# Patient Record
Sex: Male | Born: 1985 | Race: White | Hispanic: No | Marital: Single | Smoking: Never smoker
Health system: Southern US, Community
[De-identification: ages and names within clinical notes are randomized; demographics above are authoritative.]

## PROBLEM LIST (undated history)

## (undated) DIAGNOSIS — N200 Calculus of kidney: Secondary | ICD-10-CM

## (undated) DIAGNOSIS — F419 Anxiety disorder, unspecified: Secondary | ICD-10-CM

## (undated) DIAGNOSIS — Z87440 Personal history of urinary (tract) infections: Secondary | ICD-10-CM

## (undated) HISTORY — PX: LITHOTRIPSY: SUR834

---
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
## 2006-02-09 ENCOUNTER — Emergency Department: Payer: Self-pay | Admitting: Emergency Medicine

## 2006-06-15 ENCOUNTER — Emergency Department: Payer: Self-pay | Admitting: General Practice

## 2006-06-16 ENCOUNTER — Emergency Department: Payer: Self-pay | Admitting: Emergency Medicine

## 2008-12-23 ENCOUNTER — Emergency Department: Payer: Self-pay | Admitting: Emergency Medicine

## 2009-03-03 ENCOUNTER — Emergency Department (HOSPITAL_COMMUNITY): Admission: EM | Admit: 2009-03-03 | Discharge: 2009-03-04 | Payer: Self-pay | Admitting: Emergency Medicine

## 2013-03-01 ENCOUNTER — Emergency Department (HOSPITAL_COMMUNITY): Payer: BC Managed Care – PPO

## 2013-03-01 ENCOUNTER — Emergency Department (HOSPITAL_COMMUNITY)
Admission: EM | Admit: 2013-03-01 | Discharge: 2013-03-01 | Disposition: A | Discharge status: Home / Self Care | Payer: BC Managed Care – PPO | Attending: Emergency Medicine | Admitting: Emergency Medicine

## 2013-03-01 ENCOUNTER — Encounter (HOSPITAL_COMMUNITY): Payer: Self-pay | Admitting: *Deleted

## 2013-03-01 DIAGNOSIS — Z79899 Other long term (current) drug therapy: Secondary | ICD-10-CM | POA: Insufficient documentation

## 2013-03-01 DIAGNOSIS — R112 Nausea with vomiting, unspecified: Secondary | ICD-10-CM | POA: Insufficient documentation

## 2013-03-01 DIAGNOSIS — Z87442 Personal history of urinary calculi: Secondary | ICD-10-CM | POA: Insufficient documentation

## 2013-03-01 DIAGNOSIS — N23 Unspecified renal colic: Secondary | ICD-10-CM

## 2013-03-01 HISTORY — DX: Calculus of kidney: N20.0

## 2013-03-01 LAB — CBC WITH DIFFERENTIAL/PLATELET
Basophils Absolute: 0 10*3/uL (ref 0.0–0.1)
Basophils Relative: 0 % (ref 0–1)
Eosinophils Absolute: 0.1 10*3/uL (ref 0.0–0.7)
Eosinophils Relative: 1 % (ref 0–5)
HCT: 49.9 % (ref 39.0–52.0)
Hemoglobin: 18.3 g/dL — ABNORMAL HIGH (ref 13.0–17.0)
Lymphocytes Relative: 15 % (ref 12–46)
Lymphs Abs: 2.1 10*3/uL (ref 0.7–4.0)
MCH: 31 pg (ref 26.0–34.0)
MCHC: 36.7 g/dL — ABNORMAL HIGH (ref 30.0–36.0)
MCV: 84.4 fL (ref 78.0–100.0)
Monocytes Absolute: 1.1 10*3/uL — ABNORMAL HIGH (ref 0.1–1.0)
Monocytes Relative: 8 % (ref 3–12)
Neutro Abs: 10.4 10*3/uL — ABNORMAL HIGH (ref 1.7–7.7)
Neutrophils Relative %: 76 % (ref 43–77)
Platelets: 214 10*3/uL (ref 150–400)
RBC: 5.91 MIL/uL — ABNORMAL HIGH (ref 4.22–5.81)
RDW: 12.7 % (ref 11.5–15.5)
WBC: 13.7 10*3/uL — ABNORMAL HIGH (ref 4.0–10.5)

## 2013-03-01 LAB — COMPREHENSIVE METABOLIC PANEL
ALT: 140 U/L — ABNORMAL HIGH (ref 0–53)
AST: 71 U/L — ABNORMAL HIGH (ref 0–37)
Albumin: 4.8 g/dL (ref 3.5–5.2)
Alkaline Phosphatase: 52 U/L (ref 39–117)
BUN: 19 mg/dL (ref 6–23)
CO2: 29 mEq/L (ref 19–32)
Calcium: 10.1 mg/dL (ref 8.4–10.5)
Chloride: 104 mEq/L (ref 96–112)
Creatinine, Ser: 1.46 mg/dL — ABNORMAL HIGH (ref 0.50–1.35)
GFR calc Af Amer: 75 mL/min — ABNORMAL LOW (ref >90)
GFR calc non Af Amer: 65 mL/min — ABNORMAL LOW (ref >90)
Glucose, Bld: 104 mg/dL — ABNORMAL HIGH (ref 70–99)
Potassium: 4.2 mEq/L (ref 3.5–5.1)
Sodium: 143 mEq/L (ref 135–145)
Total Bilirubin: 0.5 mg/dL (ref 0.3–1.2)
Total Protein: 8.1 g/dL (ref 6.0–8.3)

## 2013-03-01 LAB — URINALYSIS, ROUTINE W REFLEX MICROSCOPIC
Bilirubin Urine: NEGATIVE
Glucose, UA: NEGATIVE mg/dL
Hgb urine dipstick: NEGATIVE
Ketones, ur: 15 mg/dL — AB
Nitrite: NEGATIVE
Protein, ur: 30 mg/dL — AB
Specific Gravity, Urine: 1.024 (ref 1.005–1.030)
Urobilinogen, UA: 0.2 mg/dL (ref 0.0–1.0)
pH: 6.5 (ref 5.0–8.0)

## 2013-03-01 LAB — LIPASE, BLOOD: Lipase: 22 U/L (ref 11–59)

## 2013-03-01 MED ORDER — ONDANSETRON HCL 4 MG PO TABS: 4.0000 mg | ORAL_TABLET | Freq: Four times a day (QID) | ORAL | Status: DC

## 2013-03-01 MED ORDER — IOHEXOL 300 MG/ML  SOLN
25.0000 mL | INTRAMUSCULAR | Status: AC
  Administered 2013-03-01 (×2): 25 mL via ORAL

## 2013-03-01 MED ORDER — ONDANSETRON HCL 4 MG/2ML IJ SOLN
4.0000 mg | Freq: Once | INTRAMUSCULAR | Status: DC
  Filled 2013-03-01: qty 2

## 2013-03-01 MED ORDER — OXYCODONE-ACETAMINOPHEN 5-325 MG PO TABS: 2.0000 | ORAL_TABLET | ORAL | Status: DC | PRN

## 2013-03-01 MED ORDER — IBUPROFEN 800 MG PO TABS: 800.0000 mg | ORAL_TABLET | Freq: Three times a day (TID) | ORAL | Status: DC

## 2013-03-01 MED ORDER — MORPHINE SULFATE 4 MG/ML IJ SOLN
4.0000 mg | Freq: Once | INTRAMUSCULAR | Status: DC
  Filled 2013-03-01: qty 1

## 2013-03-01 MED ORDER — SODIUM CHLORIDE 0.9 % IV BOLUS (SEPSIS)
1000.0000 mL | Freq: Once | INTRAVENOUS | Status: AC
  Administered 2013-03-01: 1000 mL via INTRAVENOUS

## 2013-03-01 MED ORDER — IOHEXOL 300 MG/ML  SOLN
100.0000 mL | Freq: Once | INTRAMUSCULAR | Status: AC | PRN
  Administered 2013-03-01: 100 mL via INTRAVENOUS

## 2013-03-01 NOTE — ED Notes (Signed)
Pt aware of the need for a urine sample. Urinal at bedside. 

## 2013-03-01 NOTE — ED Notes (Signed)
Patient transported to CT 

## 2013-03-01 NOTE — Consult Note (Signed)
Urology Consult  Referring physician: Dr. Manus Gunning Reason for referral: kidney stones  Chief Complaint:  RLQ pain , nausea and vomiting  History of Present Illness:   27 yo male from [place] with RLQ pain since 10 AM, seen in [facility] ED at 4pm, with N, vomiting, and RLQ pain, elevated wbc. He has had CT scan, showing bilateral renal stones, with a large L renal pelvic stone-all non-obstructive. No hydronephrosis or ureteral stones found.    The patient drinks 3 soft drinks/week, as well as tea and water. He is employed as a Actor.   Past Medical History  Diagnosis Date  . Kidney stone    History reviewed. No pertinent past surgical history.  Medications: Mata have reviewed the patient's current medications. Allergies:  Allergies  Allergen Reactions  . Sulfa Antibiotics     Childhood allergy    History reviewed. No pertinent family history. Social History:  reports that he does not drink alcohol or use illicit drugs. His tobacco history is not on file.  ROS: All systems are reviewed and negative except as noted.   Physical Exam:  Vital signs in last 24 hours: Temp:  [98.7 F (37.1 C)-98.8 F (37.1 C)] 98.7 F (37.1 C) (08/26 1827) Pulse Rate:  [73-105] 83 (08/26 1958) Resp:  [14-18] 14 (08/26 1958) BP: (125-154)/(68-102) 125/68 mmHg (08/26 1958) SpO2:  [96 %-98 %] 96 % (08/26 1958)  Cardiovascular: Skin warm; not flushed Respiratory: Breaths quiet; no shortness of breath Abdomen: No masses Neurological: Normal sensation to touch Musculoskeletal: Normal motor function arms and legs Lymphatics: No inguinal adenopathy Skin: No rashes Genitourinary:normal BUS  Laboratory Data:  Results for orders placed during the hospital encounter of 03/01/13 (from the past 72 hour(s))  CBC WITH DIFFERENTIAL     Status: Abnormal   Collection Time    03/01/13  4:14 PM      Result Value Range   WBC 13.7 (*) 4.0 - 10.5 K/uL   RBC 5.91 (*) 4.22 - 5.81 MIL/uL   Hemoglobin 18.3 (*) 13.0  - 17.0 g/dL   HCT 16.1  09.6 - 04.5 %   MCV 84.4  78.0 - 100.0 fL   MCH 31.0  26.0 - 34.0 pg   MCHC 36.7 (*) 30.0 - 36.0 g/dL   RDW 40.9  81.1 - 91.4 %   Platelets 214  150 - 400 K/uL   Neutrophils Relative % 76  43 - 77 %   Neutro Abs 10.4 (*) 1.7 - 7.7 K/uL   Lymphocytes Relative 15  12 - 46 %   Lymphs Abs 2.1  0.7 - 4.0 K/uL   Monocytes Relative 8  3 - 12 %   Monocytes Absolute 1.1 (*) 0.1 - 1.0 K/uL   Eosinophils Relative 1  0 - 5 %   Eosinophils Absolute 0.1  0.0 - 0.7 K/uL   Basophils Relative 0  0 - 1 %   Basophils Absolute 0.0  0.0 - 0.1 K/uL  COMPREHENSIVE METABOLIC PANEL     Status: Abnormal   Collection Time    03/01/13  4:14 PM      Result Value Range   Sodium 143  135 - 145 mEq/L   Potassium 4.2  3.5 - 5.1 mEq/L   Chloride 104  96 - 112 mEq/L   CO2 29  19 - 32 mEq/L   Glucose, Bld 104 (*) 70 - 99 mg/dL   BUN 19  6 - 23 mg/dL   Creatinine, Ser 7.82 (*) 0.50 -  1.35 mg/dL   Calcium 95.6  8.4 - 21.3 mg/dL   Total Protein 8.1  6.0 - 8.3 g/dL   Albumin 4.8  3.5 - 5.2 g/dL   AST 71 (*) 0 - 37 U/L   ALT 140 (*) 0 - 53 U/L   Alkaline Phosphatase 52  39 - 117 U/L   Total Bilirubin 0.5  0.3 - 1.2 mg/dL   GFR calc non Af Amer 65 (*) >90 mL/min   GFR calc Af Amer 75 (*) >90 mL/min   Comment: (NOTE)     The eGFR has been calculated using the CKD EPI equation.     This calculation has not been validated in all clinical situations.     eGFR's persistently <90 mL/min signify possible Chronic Kidney     Disease.  LIPASE, BLOOD     Status: None   Collection Time    03/01/13  4:14 PM      Result Value Range   Lipase 22  11 - 59 U/L  URINALYSIS, ROUTINE W REFLEX MICROSCOPIC     Status: Abnormal   Collection Time    03/01/13  7:28 PM      Result Value Range   Color, Urine YELLOW  YELLOW   APPearance CLEAR  CLEAR   Specific Gravity, Urine 1.024  1.005 - 1.030   pH 6.5  5.0 - 8.0   Glucose, UA NEGATIVE  NEGATIVE mg/dL   Hgb urine dipstick NEGATIVE  NEGATIVE   Bilirubin  Urine NEGATIVE  NEGATIVE   Ketones, ur 15 (*) NEGATIVE mg/dL   Protein, ur 30 (*) NEGATIVE mg/dL   Urobilinogen, UA 0.2  0.0 - 1.0 mg/dL   Nitrite NEGATIVE  NEGATIVE   Leukocytes, UA SMALL (*) NEGATIVE  URINE MICROSCOPIC-ADD ON     Status: None   Collection Time    03/01/13  7:28 PM      Result Value Range   Squamous Epithelial / LPF RARE  RARE   WBC, UA 3-6  <3 WBC/hpf   RBC / HPF 0-2  <3 RBC/hpf   Bacteria, UA RARE  RARE   No results found for this or any previous visit (from the past 240 hour(s)). Creatinine:  Recent Labs  03/01/13 1614  CREATININE 1.46*    Xrays: CT ABDOMEN AND PELVIS WITH CONTRAST  Technique: Multidetector CT imaging of the abdomen and pelvis was  performed following the standard protocol during bolus  administration of intravenous contrast.  Contrast: OMNIPAQUE IOHEXOL 300 MG/ML SOLN  Comparison: None.  Findings: Sagittal images of the spine shows disc space flattening  with posterior spurring and disc bulge at L5-S1 level.  Lung bases are unremarkable. Small hiatal hernia. Mild hepatic  fatty infiltration. No focal hepatic mass. No calcified  gallstones are noted within gallbladder. The pancreas, spleen and  adrenal glands are unremarkable.  Kidneys are symmetrical in size and enhancement. At least three  nonobstructive calculi are noted within the left kidney the largest  measures 4.5 mm. Nonobstructive calcified calculus in mid pole of  the right kidney measures 4.6 mm. There is a nonobstructive ovoid  calcified calculus in the right renal pelvis measures 1.3 cm by 0.8  cm. Mild stranding of the fat surrounding right renal pelvis and  proximal right ureter. Urinary tract inflammation or a recent  passed right ureteral calculus cannot be excluded. No calcified  ureteral calculi are identified bilaterally. No hydronephrosis or  hydroureter.  There is no pericecal inflammation. Moderate stool noted within  cecum. Normal appendix is  partially visualized axial image 62.  Normal appendix clearly visualized in coronal image 56.  Prostate gland and seminal vesicles are unremarkable. Bilateral  distal ureter is unremarkable. No calcified calculi are noted  within urinary bladder. No destructive bony lesions are noted  within pelvis.  No small bowel obstruction. No ascites or free air. No  adenopathy.  IMPRESSION:  1. There is bilateral nonobstructive nephrolithiasis. No  hydronephrosis or hydroureter. No calcified ureteral calculi.  2. Mild hepatic fatty infiltration.  3. No pericecal inflammation. Normal appendix is partially  visualized.  4. There is a nonobstructive calcified calculus in the right renal  pelvis measures 1.3 x 0.8 cm. Mild stranding of the surrounding  fat right renal pelvis and proximal right ureter. Mild urinary  tract inflammation or recent passed right ureteral calculus cannot  be excluded. Clinical correlation is necessary.  5. No calcified calculi are noted within urinary bladder. No  small bowel obstruction.  Original Report Authenticated By: Natasha Mead, M.D.      *Impression/Assessment:   Bilateral non-obstructive nephrolithiasis, with large L renal stone, which will need lithotripsy.   Plan:    Return to office for surgery planning, [phone].  Pain medicine tonight.   Kyle Mata 03/01/2013, 9:07 PM

## 2013-03-01 NOTE — ED Notes (Signed)
Pt reports onset of RLQ pain since 10am. Having n/v. Denies diarrhea. Pt diaphoretic and vomiting at triage.

## 2013-03-01 NOTE — ED Notes (Signed)
MD at bedside. 

## 2013-03-01 NOTE — ED Notes (Signed)
Pt reminded of the need of the urine sample. Pt unable to void at this time. Urinal at bedside.

## 2013-03-01 NOTE — ED Provider Notes (Signed)
CSN: [id number]     Arrival date & time 03/01/13  1606 History   First MD Initiated Contact with Patient 03/01/13 1618     Chief Complaint  Patient presents with  . Abdominal Pain   (Consider location/radiation/quality/duration/timing/severity/associated sxs/prior Treatment) HPI Comments: Patient presents with right lower quadrant pain since 10 AM associated with nausea and vomiting. He states that this pain on and off for the past month and. History of kidney stones but no other medical problems. Denies any dysuria, hematuria, testicular pain. No change in bowel habits. No chest pain or shortness of breath. No fever. Pain is worse with palpation and constant.  The history is provided by the patient.    Past Medical History  Diagnosis Date  . Kidney stone    History reviewed. No pertinent past surgical history. History reviewed. No pertinent family history. History  Substance Use Topics  . Smoking status: Not on file  . Smokeless tobacco: Not on file  . Alcohol Use: No    Review of Systems  Constitutional: Positive for activity change and appetite change. Negative for fever.  HENT: Negative for congestion, rhinorrhea and neck pain.   Respiratory: Negative for chest tightness.   Cardiovascular: Negative for chest pain.  Gastrointestinal: Positive for nausea, vomiting and abdominal pain. Negative for diarrhea.  Genitourinary: Negative for dysuria, hematuria and testicular pain.  Musculoskeletal: Negative for back pain.  Skin: Negative for rash.  Neurological: Negative for dizziness, weakness and headaches.  A complete 10 system review of systems was obtained and all systems are negative except as noted in the HPI and PMH.    Allergies  Sulfa antibiotics  Home Medications   Current Outpatient Rx  Name  Route  Sig  Dispense  Refill  . ibuprofen (ADVIL,MOTRIN) 800 MG tablet   Oral   Take 1 tablet (800 mg total) by mouth 3 (three) times daily.   21 tablet   0   .  ondansetron (ZOFRAN) 4 MG tablet   Oral   Take 1 tablet (4 mg total) by mouth every 6 (six) hours.   12 tablet   0   . oxyCODONE-acetaminophen (PERCOCET/ROXICET) 5-325 MG per tablet   Oral   Take 2 tablets by mouth every 4 (four) hours as needed for pain.   15 tablet   0    BP 125/68  Pulse 83  Temp(Src) 98.7 F (37.1 C) (Oral)  Resp 14  SpO2 96% Physical Exam  Constitutional: He is oriented to person, place, and time. He appears well-developed and well-nourished. He appears distressed.  Uncomfortable, diaphoretic  HENT:  Head: Normocephalic and atraumatic.  Mouth/Throat: Oropharynx is clear and moist.  Eyes: Conjunctivae and EOM are normal. Pupils are equal, round, and reactive to light.  Neck: Normal range of motion. Neck supple.  Cardiovascular: Normal rate, regular rhythm and normal heart sounds.   No murmur heard. Pulmonary/Chest: Effort normal and breath sounds normal. No respiratory distress.  Abdominal: Soft. There is tenderness. There is guarding. There is no rebound.  Genitourinary:  No testicular tenderness  Musculoskeletal: Normal range of motion. He exhibits no edema and no tenderness.  Neurological: He is alert and oriented to person, place, and time. No cranial nerve deficit. He exhibits normal muscle tone. Coordination normal.  Skin: Skin is warm. He is diaphoretic.    ED Course  Procedures (including critical care time) Labs Review Labs Reviewed  CBC WITH DIFFERENTIAL - Abnormal; Notable for the following:    WBC 13.7 (*)  RBC 5.91 (*)    Hemoglobin 18.3 (*)    MCHC 36.7 (*)    Neutro Abs 10.4 (*)    Monocytes Absolute 1.1 (*)    All other components within normal limits  COMPREHENSIVE METABOLIC PANEL - Abnormal; Notable for the following:    Glucose, Bld 104 (*)    Creatinine, Ser 1.46 (*)    AST 71 (*)    ALT 140 (*)    GFR calc non Af Amer 65 (*)    GFR calc Af Amer 75 (*)    All other components within normal limits  URINALYSIS, ROUTINE  W REFLEX MICROSCOPIC - Abnormal; Notable for the following:    Ketones, ur 15 (*)    Protein, ur 30 (*)    Leukocytes, UA SMALL (*)    All other components within normal limits  URINE CULTURE  LIPASE, BLOOD  URINE MICROSCOPIC-ADD ON   Imaging Review Ct Abdomen Pelvis W Contrast  03/01/2013   *RADIOLOGY REPORT*  Clinical Data: Right lower quadrant pain, vomiting  CT ABDOMEN AND PELVIS WITH CONTRAST  Technique:  Multidetector CT imaging of the abdomen and pelvis was performed following the standard protocol during bolus administration of intravenous contrast.  Contrast: OMNIPAQUE IOHEXOL 300 MG/ML  SOLN  Comparison: None.  Findings: Sagittal images of the spine shows disc space flattening with posterior spurring and disc bulge at L5-S1 level.  Lung bases are unremarkable.  Small hiatal hernia.  Mild hepatic fatty infiltration.  No focal hepatic mass.  No calcified gallstones are noted within gallbladder.  The pancreas, spleen and adrenal glands are unremarkable.  Kidneys are symmetrical in size and enhancement.  At least three nonobstructive calculi are noted within the left kidney the largest measures 4.5 mm.  Nonobstructive calcified calculus in mid pole of the right kidney measures 4.6 mm.  There is a nonobstructive ovoid calcified calculus in the right renal pelvis measures 1.3 cm by 0.8 cm.  Mild stranding of the fat surrounding right renal pelvis and proximal right ureter.  Urinary tract inflammation or a recent passed right ureteral calculus cannot be excluded.  No calcified ureteral calculi are identified bilaterally.  No hydronephrosis or hydroureter.  There is no pericecal inflammation.  Moderate stool noted within cecum.  Normal appendix is partially visualized axial image 62. Normal appendix clearly visualized in coronal image 56.  Prostate gland  and seminal vesicles are unremarkable.  Bilateral distal ureter is unremarkable.  No calcified calculi are noted within urinary bladder.  No  destructive bony lesions are noted within pelvis.  No small bowel obstruction.  No ascites or free air.  No adenopathy.  IMPRESSION:  1.  There is bilateral nonobstructive nephrolithiasis.  No hydronephrosis or hydroureter.  No calcified ureteral calculi. 2.  Mild hepatic fatty infiltration. 3.  No pericecal inflammation.  Normal appendix is partially visualized. 4.  There is a nonobstructive calcified calculus in the right renal pelvis measures 1.3 x 0.8 cm.  Mild stranding of the surrounding fat right renal pelvis and proximal right ureter.  Mild urinary tract inflammation or recent passed right ureteral calculus cannot be excluded.  Clinical correlation is necessary.  5.  No calcified calculi are noted within urinary bladder.  No small bowel obstruction.   Original Report Authenticated By: Natasha Mead, M.D.    MDM   1. Renal colic    Right lower quadrant pain with nausea, vomiting and diaphoresis. Vital stable, no distress.  Concern for appendicitis versus renal colic. Urinalysis shows small  sites and white cells. No obvious infection.  CT shows normal appendix. There is a large calculus in the right renal pelvis with surrounding fat stranding. UA does not appear infected.  Will send culture.   Discussed with Dr. Patsi Sears urology. He did come evaluate the patient. Patient's pain is much improved after medications.  Dr. Patsi Sears has seen the patient and has arranged office followup for lithotripsy.  Patient controlled in the ED and tolerating PO in the ED.  Return precautions discussed.   Glynn Octave, MD 03/01/13 2138

## 2013-03-01 NOTE — Progress Notes (Signed)
ED CM meet with patient in regards to PCP. Pt states, he does not have a PCP, But he does have health insurance. Explained the importance of a PCP for preventive, maintanence, and follow up care, pt verbalizes understanding and agrees. Pt instructed to obtain number from card to find  Provider within his network. Pt verbalizes understanding. No further CM needs identified.

## 2013-03-03 LAB — URINE CULTURE: Culture: NO GROWTH

## 2013-03-10 ENCOUNTER — Encounter (HOSPITAL_COMMUNITY): Payer: Self-pay | Admitting: *Deleted

## 2013-03-11 ENCOUNTER — Other Ambulatory Visit: Payer: Self-pay | Admitting: Urology

## 2013-03-14 ENCOUNTER — Encounter (HOSPITAL_COMMUNITY)
Admission: RE | Disposition: A | Discharge status: Home / Self Care | Payer: Self-pay | Source: Ambulatory Visit | Attending: Urology

## 2013-03-14 ENCOUNTER — Ambulatory Visit (HOSPITAL_COMMUNITY)
Admission: RE | Admit: 2013-03-14 | Discharge: 2013-03-14 | Disposition: A | Discharge status: Home / Self Care | Payer: BC Managed Care – PPO | Source: Ambulatory Visit | Attending: Urology | Admitting: Urology

## 2013-03-14 ENCOUNTER — Ambulatory Visit (HOSPITAL_COMMUNITY): Payer: BC Managed Care – PPO

## 2013-03-14 ENCOUNTER — Encounter (HOSPITAL_COMMUNITY): Payer: Self-pay | Admitting: General Practice

## 2013-03-14 DIAGNOSIS — K219 Gastro-esophageal reflux disease without esophagitis: Secondary | ICD-10-CM | POA: Insufficient documentation

## 2013-03-14 DIAGNOSIS — N200 Calculus of kidney: Secondary | ICD-10-CM | POA: Insufficient documentation

## 2013-03-14 SURGERY — LITHOTRIPSY, ESWL
Anesthesia: LOCAL | Laterality: Right

## 2013-03-14 MED ORDER — DEXTROSE-NACL 5-0.45 % IV SOLN
INTRAVENOUS | Status: DC
  Administered 2013-03-14: 10:00:00 via INTRAVENOUS

## 2013-03-14 MED ORDER — DIPHENHYDRAMINE HCL 25 MG PO CAPS
25.0000 mg | ORAL_CAPSULE | ORAL | Status: AC
  Administered 2013-03-14: 25 mg via ORAL
  Filled 2013-03-14: qty 1

## 2013-03-14 MED ORDER — DIAZEPAM 5 MG PO TABS
10.0000 mg | ORAL_TABLET | ORAL | Status: AC
  Administered 2013-03-14: 10 mg via ORAL
  Filled 2013-03-14: qty 2

## 2013-03-14 MED ORDER — CEFAZOLIN SODIUM-DEXTROSE 2-3 GM-% IV SOLR
2.0000 g | INTRAVENOUS | Status: AC
  Administered 2013-03-14: 2 g via INTRAVENOUS
  Filled 2013-03-14: qty 50

## 2013-03-14 NOTE — H&P (Signed)
Kidney stone   Active Problems Problems  1. Nephrolithiasis Of Both Kidneys 592.0  History of Present Illness        27 yo single male presents today to discuss ESWL.  He was seen in the ER on 03/01/13 for RLQ abdominal pain associated with nausea/vomitting.  CT showed bilateral kidney stones and a 1.3cm Rt UPJ stone.  He states that he passed a stone about 3 months ago.  He drinks 1-3 caffeinated drinks per week.   Past Medical History Problems  1. History of  Anxiety (Symptom) 300.00 2. History of  Esophageal Reflux 530.81 3. History of  Nephrolithiasis V13.01  Surgical History Problems  1. History of  No Surgical Problems  Current Meds 1. No Reported Medications  Allergies Medication  1. Sulfa Drugs  Family History Problems  1. Family history of  Family Health Status - Father's Age 65. Family history of  Family Health Status - Mother's Age 80. Family history of  Family Health Status Number Of Children 1 son 4. 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  Genitourinary: hematuria and initiating urination requires straining.  Gastrointestinal: nausea, vomiting and heartburn.  Psychiatric: anxiety.    Vitals Vital Signs [Data Includes: Last 1 Day]  28Aug2014 10:43AM  BMI Calculated: 27.96 BSA Calculated: 2.2 Height: 6 ft 1 in Weight: 211 lb  Blood Pressure: 125 / 83 Heart Rate: 73  Physical Exam Constitutional: Well nourished and well developed . No acute distress.  ENT:. The ears and nose are normal in appearance.  Neck: The  appearance of the neck is normal and no neck mass is present.  Pulmonary: No respiratory distress and normal respiratory rhythm and effort.  Cardiovascular: Heart rate and rhythm are normal . No peripheral edema.  Abdomen: The abdomen is rounded. The abdomen is soft and nontender. No masses are palpated. The abdomen is normal to percussion. The abdomen is not firm. No CVA tenderness. No hernias are palpable. No hepatosplenomegaly noted.  Rectal: Rectal exam demonstrates normal sphincter tone, no tenderness and no masses. The prostate has no nodularity and is not tender. The left seminal vesicle is nonpalpable. The right seminal vesicle is nonpalpable. The perineum is normal on inspection.  Genitourinary: Examination of the penis demonstrates no discharge, no masses, no lesions and a normal meatus. The scrotum is without lesions. The right epididymis is palpably normal and non-tender. The left epididymis is palpably normal and non-tender. The right testis is non-tender and without masses. The left testis is non-tender and without masses.  Lymphatics: The femoral and inguinal nodes are not enlarged or tender.  Skin: Normal skin turgor, no visible rash and no visible skin lesions.  Neuro/Psych:. Mood and affect are appropriate.    Assessment Assessed  1. Nephrolithiasis Of Both Kidneys 592.0   40 minutes: We have reviewed the CT from [facility] together, and note bilateral nephrolithiasis:  1.3 cm stone in the R renal pelvis and 3 separate stones in the L kidney (largest=4.103mm), No obstruction noted. He brings a stone which he passed recently to the office..    We will obtain litholink, and have reviewed his  CT together, and discussed alternatives for Rx of his large R rneal stone, including lithotripsy with or without JJ stent; percutaneous nephrolithotomy; and open surgery. He wil take Sprix following lithotripsy. He dronks no soft drinks, and has normal diet. He may need medication for his stone disease. He  wsill need Flomax and potassium citrate following lithotripsy.   Plan  1. Schedule lithotripsy 2. litholink 3. Flomax, Sprix  4. potassium citrate. after lithotripsy   Signatures Electronically signed by : [name], M.D.; Mar 03 2013 11:34AM

## 2013-03-14 NOTE — Interval H&P Note (Signed)
History and Physical Interval Note:  03/14/2013 11:23 AM  Kyle Mata  has presented today for surgery, with the diagnosis of right renal stone  The various methods of treatment have been discussed with the patient and family. After consideration of risks, benefits and other options for treatment, the patient has consented to  Procedure(s): RIGHT EXTRACORPOREAL SHOCK WAVE LITHOTRIPSY (ESWL) (Right) as a surgical intervention .  The patient's history has been reviewed, patient examined, no change in status, stable for surgery.  I have reviewed the patient's chart and labs.  Questions were answered to the patient's satisfaction.     [name] I

## 2013-03-21 ENCOUNTER — Other Ambulatory Visit: Payer: Self-pay | Admitting: Urology

## 2013-11-01 ENCOUNTER — Ambulatory Visit
Admission: RE | Admit: 2013-11-01 | Discharge: 2013-11-01 | Disposition: A | Discharge status: Home / Self Care | Payer: BC Managed Care – PPO | Source: Ambulatory Visit | Attending: General Practice | Admitting: General Practice

## 2013-11-01 ENCOUNTER — Other Ambulatory Visit: Payer: Self-pay | Admitting: General Practice

## 2013-11-01 DIAGNOSIS — K6289 Other specified diseases of anus and rectum: Secondary | ICD-10-CM

## 2013-11-01 DIAGNOSIS — R109 Unspecified abdominal pain: Secondary | ICD-10-CM

## 2013-11-01 MED ORDER — IOHEXOL 300 MG/ML  SOLN
125.0000 mL | Freq: Once | INTRAMUSCULAR | Status: AC | PRN
  Administered 2013-11-01: 125 mL via INTRAVENOUS

## 2013-11-01 MED ORDER — IOHEXOL 300 MG/ML  SOLN
40.0000 mL | Freq: Once | INTRAMUSCULAR | Status: AC | PRN
  Administered 2013-11-01: 40 mL via ORAL

## 2013-11-03 ENCOUNTER — Encounter (HOSPITAL_COMMUNITY): Payer: Self-pay | Admitting: *Deleted

## 2013-11-03 ENCOUNTER — Other Ambulatory Visit: Payer: Self-pay | Admitting: Urology

## 2013-11-03 NOTE — Progress Notes (Signed)
Surgery on 11/04/13.  Need orders in EPIC.  Thank You.

## 2013-11-04 ENCOUNTER — Ambulatory Visit (HOSPITAL_COMMUNITY)
Admission: RE | Admit: 2013-11-04 | Discharge: 2013-11-04 | Disposition: A | Discharge status: Home / Self Care | Payer: BC Managed Care – PPO | Source: Ambulatory Visit | Attending: Urology | Admitting: Urology

## 2013-11-04 ENCOUNTER — Ambulatory Visit (HOSPITAL_COMMUNITY): Payer: BC Managed Care – PPO | Admitting: *Deleted

## 2013-11-04 ENCOUNTER — Encounter (HOSPITAL_COMMUNITY): Payer: Self-pay | Admitting: *Deleted

## 2013-11-04 ENCOUNTER — Encounter (HOSPITAL_COMMUNITY)
Admission: RE | Disposition: A | Discharge status: Home / Self Care | Payer: Self-pay | Source: Ambulatory Visit | Attending: Urology

## 2013-11-04 ENCOUNTER — Encounter (HOSPITAL_COMMUNITY): Payer: BC Managed Care – PPO | Admitting: *Deleted

## 2013-11-04 DIAGNOSIS — N200 Calculus of kidney: Secondary | ICD-10-CM | POA: Insufficient documentation

## 2013-11-04 DIAGNOSIS — K219 Gastro-esophageal reflux disease without esophagitis: Secondary | ICD-10-CM | POA: Insufficient documentation

## 2013-11-04 HISTORY — PX: CYSTOSCOPY/RETROGRADE/URETEROSCOPY: SHX5316

## 2013-11-04 HISTORY — PX: HOLMIUM LASER APPLICATION: SHX5852

## 2013-11-04 LAB — CBC
HEMATOCRIT: 49.3 % (ref 39.0–52.0)
Hemoglobin: 17 g/dL (ref 13.0–17.0)
MCH: 29.2 pg (ref 26.0–34.0)
MCHC: 34.5 g/dL (ref 30.0–36.0)
MCV: 84.7 fL (ref 78.0–100.0)
PLATELETS: 223 10*3/uL (ref 150–400)
RBC: 5.82 MIL/uL — ABNORMAL HIGH (ref 4.22–5.81)
RDW: 13.4 % (ref 11.5–15.5)
WBC: 8 10*3/uL (ref 4.0–10.5)

## 2013-11-04 SURGERY — CYSTOSCOPY/RETROGRADE/URETEROSCOPY
Anesthesia: General | Site: Ureter | Laterality: Right

## 2013-11-04 MED ORDER — HYDROMORPHONE HCL PF 1 MG/ML IJ SOLN
0.2500 mg | INTRAMUSCULAR | Status: DC | PRN
  Administered 2013-11-04 (×2): 0.25 mg via INTRAVENOUS

## 2013-11-04 MED ORDER — SODIUM CHLORIDE 0.9 % IR SOLN
Status: DC | PRN
  Administered 2013-11-04: 3000 mL

## 2013-11-04 MED ORDER — SODIUM CHLORIDE 0.9 % IR SOLN
Status: DC | PRN
  Administered 2013-11-04 (×2): 1000 mL

## 2013-11-04 MED ORDER — PROPOFOL 10 MG/ML IV BOLUS
INTRAVENOUS | Status: AC
  Filled 2013-11-04: qty 20

## 2013-11-04 MED ORDER — CEFAZOLIN SODIUM-DEXTROSE 2-3 GM-% IV SOLR
INTRAVENOUS | Status: AC
  Filled 2013-11-04: qty 50

## 2013-11-04 MED ORDER — LIDOCAINE HCL (CARDIAC) 20 MG/ML IV SOLN
INTRAVENOUS | Status: DC | PRN
  Administered 2013-11-04: 80 mg via INTRAVENOUS

## 2013-11-04 MED ORDER — PROMETHAZINE HCL 25 MG/ML IJ SOLN: 6.2500 mg | INTRAMUSCULAR | Status: DC | PRN

## 2013-11-04 MED ORDER — ACETAMINOPHEN 10 MG/ML IV SOLN
1000.0000 mg | Freq: Once | INTRAVENOUS | Status: AC
  Administered 2013-11-04: 1000 mg via INTRAVENOUS
  Filled 2013-11-04 (×2): qty 100

## 2013-11-04 MED ORDER — MIDAZOLAM HCL 2 MG/2ML IJ SOLN
INTRAMUSCULAR | Status: AC
  Filled 2013-11-04: qty 2

## 2013-11-04 MED ORDER — 0.9 % SODIUM CHLORIDE (POUR BTL) OPTIME
TOPICAL | Status: DC | PRN
  Administered 2013-11-04: 1000 mL

## 2013-11-04 MED ORDER — METHENAMINE MAND-SOD PHOSPHATE 500-500 MG PO TABS: 1.0000 | ORAL_TABLET | Freq: Four times a day (QID) | ORAL | Status: DC

## 2013-11-04 MED ORDER — OXYCODONE-ACETAMINOPHEN 5-325 MG PO TABS
1.0000 | ORAL_TABLET | ORAL | Status: DC | PRN
  Administered 2013-11-04: 1 via ORAL
  Filled 2013-11-04: qty 1

## 2013-11-04 MED ORDER — MIDAZOLAM HCL 5 MG/5ML IJ SOLN
INTRAMUSCULAR | Status: DC | PRN
  Administered 2013-11-04: 2 mg via INTRAVENOUS

## 2013-11-04 MED ORDER — PROPOFOL 10 MG/ML IV BOLUS
INTRAVENOUS | Status: DC | PRN
  Administered 2013-11-04 (×2): 100 mg via INTRAVENOUS

## 2013-11-04 MED ORDER — KETOROLAC TROMETHAMINE 30 MG/ML IJ SOLN
INTRAMUSCULAR | Status: AC
  Filled 2013-11-04: qty 1

## 2013-11-04 MED ORDER — FENTANYL CITRATE 0.05 MG/ML IJ SOLN
INTRAMUSCULAR | Status: AC
  Filled 2013-11-04: qty 5

## 2013-11-04 MED ORDER — HYDROMORPHONE HCL PF 1 MG/ML IJ SOLN
INTRAMUSCULAR | Status: AC
  Filled 2013-11-04: qty 1

## 2013-11-04 MED ORDER — CEFAZOLIN SODIUM-DEXTROSE 2-3 GM-% IV SOLR
2.0000 g | INTRAVENOUS | Status: AC
  Administered 2013-11-04: 2 g via INTRAVENOUS

## 2013-11-04 MED ORDER — BELLADONNA ALKALOIDS-OPIUM 16.2-60 MG RE SUPP
RECTAL | Status: DC | PRN
  Administered 2013-11-04: 1 via RECTAL

## 2013-11-04 MED ORDER — METOCLOPRAMIDE HCL 5 MG/ML IJ SOLN
INTRAMUSCULAR | Status: DC | PRN
Start: 2013-11-04 — End: 2013-11-04
  Administered 2013-11-04: 10 mg via INTRAVENOUS

## 2013-11-04 MED ORDER — LACTATED RINGERS IV SOLN
INTRAVENOUS | Status: DC
  Administered 2013-11-04: 13:00:00 via INTRAVENOUS
  Administered 2013-11-04: 1000 mL via INTRAVENOUS

## 2013-11-04 MED ORDER — BELLADONNA ALKALOIDS-OPIUM 16.2-60 MG RE SUPP
RECTAL | Status: AC
  Filled 2013-11-04: qty 1

## 2013-11-04 MED ORDER — DEXAMETHASONE SODIUM PHOSPHATE 4 MG/ML IJ SOLN
INTRAMUSCULAR | Status: DC | PRN
  Administered 2013-11-04: 10 mg via INTRAVENOUS

## 2013-11-04 MED ORDER — LIDOCAINE HCL (CARDIAC) 20 MG/ML IV SOLN
INTRAVENOUS | Status: AC
  Filled 2013-11-04: qty 5

## 2013-11-04 MED ORDER — TRIMETHOPRIM 100 MG PO TABS: 100.0000 mg | ORAL_TABLET | ORAL | Status: DC

## 2013-11-04 MED ORDER — ONDANSETRON HCL 4 MG/2ML IJ SOLN
INTRAMUSCULAR | Status: DC | PRN
  Administered 2013-11-04: 4 mg via INTRAVENOUS

## 2013-11-04 MED ORDER — SUCCINYLCHOLINE CHLORIDE 20 MG/ML IJ SOLN
INTRAMUSCULAR | Status: DC | PRN
  Administered 2013-11-04: 100 mg via INTRAVENOUS

## 2013-11-04 MED ORDER — OXYCODONE-ACETAMINOPHEN 5-325 MG PO TABS: 1.0000 | ORAL_TABLET | ORAL | Status: DC | PRN

## 2013-11-04 MED ORDER — LACTATED RINGERS IV SOLN: INTRAVENOUS | Status: DC

## 2013-11-04 MED ORDER — FENTANYL CITRATE 0.05 MG/ML IJ SOLN
INTRAMUSCULAR | Status: DC | PRN
  Administered 2013-11-04 (×2): 50 ug via INTRAVENOUS
  Administered 2013-11-04: 100 ug via INTRAVENOUS

## 2013-11-04 MED ORDER — KETOROLAC TROMETHAMINE 30 MG/ML IJ SOLN
30.0000 mg | Freq: Once | INTRAMUSCULAR | Status: AC
  Administered 2013-11-04: 30 mg via INTRAVENOUS

## 2013-11-04 SURGICAL SUPPLY — 20 items
BAG URO CATCHER STRL LF (DRAPE) ×3 IMPLANT
BASKET ZERO TIP NITINOL 2.4FR (BASKET) ×4 IMPLANT
BSKT STON RTRVL ZERO TP 2.4FR (BASKET) ×2
CATH INTERMIT  6FR 70CM (CATHETERS) ×3 IMPLANT
DRAPE CAMERA CLOSED 9X96 (DRAPES) ×3 IMPLANT
FIBER LASER FLEXIVA 200 (UROLOGICAL SUPPLIES) ×2 IMPLANT
FIBER LASER FLEXIVA 365 (UROLOGICAL SUPPLIES) ×2 IMPLANT
GLOVE BIOGEL M STRL SZ7.5 (GLOVE) ×3 IMPLANT
GLOVE SURG SS PI 8.0 STRL IVOR (GLOVE) ×3 IMPLANT
GOWN STRL REUS W/TWL LRG LVL3 (GOWN DISPOSABLE) ×3 IMPLANT
GOWN STRL REUS W/TWL XL LVL3 (GOWN DISPOSABLE) ×5 IMPLANT
GUIDEWIRE STR DUAL SENSOR (WIRE) ×5 IMPLANT
MANIFOLD NEPTUNE II (INSTRUMENTS) ×3 IMPLANT
MARKER SKIN DUAL TIP RULER LAB (MISCELLANEOUS) ×3 IMPLANT
PACK CYSTO (CUSTOM PROCEDURE TRAY) ×3 IMPLANT
SCRUB PCMX 4 OZ (MISCELLANEOUS) ×1 IMPLANT
SHEATH ACCESS URETERAL 38CM (SHEATH) ×2 IMPLANT
STENT CONTOUR 6FRX26X.038 (STENTS) ×2 IMPLANT
TUBING CONNECTING 10 (TUBING) ×2 IMPLANT
TUBING CONNECTING 10' (TUBING) ×1

## 2013-11-04 NOTE — H&P (Signed)
Reason For Visit 6 mo f/u   Active Problems Problems  1. Bilateral kidney stones (592.0)  History of Present Illness     28 yo male returns today for a 6 mo f/u & to review 24hr urine results with a hx of bilateral kidney stones. He had a CT w/ abd/pelvis yesterday at [facility] for hx of Rt flank pain. He is s/p ESWL Sept 8th, 2014 of Right UPJ stone.       He drinks 1-3 caffeinated drinks per week.   Past Medical History Problems  1. History of Anxiety (300.00) 2. History of esophageal reflux (V12.79) 3. History of kidney stones (V13.01)  Surgical History Problems  1. History of Lithotripsy 2. History of No Surgical Problems  Current Meds 1. Potassium Citrate ER 15 MEQ (1620 MG) Oral Tablet Extended Release; Take 1 tablet  daily;  Therapy: 10Oct2014 to (Last Rx:24Dec2014)  Requested for: 24Dec2014 Ordered  Allergies Medication  1. Sulfa Drugs  Family History Problems  1. Family history of Family Health Status - Father's Age 5. Family history of Family Health Status - Mother's Age 93. Family history of Family Health Status Number Of Children   1 son 4. Family history of No Significant Family History  Social History Problems  1. Denied: History of Alcohol Use 2. Caffeine Use   1-3 per week 3. Marital History - Single 4. Never A Smoker 5. Occupation:   Lineman  Review of Systems Genitourinary, constitutional, skin, eye, otolaryngeal, hematologic/lymphatic, cardiovascular, pulmonary, endocrine, musculoskeletal, gastrointestinal, neurological and psychiatric system(s) were reviewed and pertinent findings if present are noted.  Genitourinary: hematuria.  Gastrointestinal: flank pain.  Psychiatric: anxiety.    Vitals Vital Signs [Data Includes: Last 1 Day]  Recorded: 29Apr2015 03:47PM  Blood Pressure: 132 / 82 Temperature: 98.8 F Heart Rate: 87  Physical Exam Constitutional: Well nourished and well developed . No acute distress.  ENT:. The ears  and nose are normal in appearance.  Neck: The appearance of the neck is normal and no neck mass is present.  Pulmonary: No respiratory distress and normal respiratory rhythm and effort.  Cardiovascular:. No peripheral edema.  Abdomen: The abdomen is soft and nontender. No masses are palpated. No CVA tenderness. No hernias are palpable. No hepatosplenomegaly noted.  Genitourinary: Examination of the penis demonstrates no discharge, no masses, no lesions and a normal meatus. The scrotum is without lesions. The right epididymis is palpably normal and non-tender. The left epididymis is palpably normal and non-tender. The right testis is non-tender and without masses. The left testis is non-tender and without masses.  Lymphatics: The femoral and inguinal nodes are not enlarged or tender.  Skin: Normal skin turgor, no visible rash and no visible skin lesions.  Neuro/Psych:. Mood and affect are appropriate.    Results/Data Urine [Data Includes: Last 1 Day]   29Apr2015  COLOR YELLOW   APPEARANCE CLEAR   SPECIFIC GRAVITY 1.025   pH 6.0   GLUCOSE NEG mg/dL  BILIRUBIN NEG   KETONE NEG mg/dL  BLOOD TRACE   PROTEIN NEG mg/dL  UROBILINOGEN 0.2 mg/dL  NITRITE NEG   LEUKOCYTE ESTERASE NEG   SQUAMOUS EPITHELIAL/HPF RARE   WBC 0-2 WBC/hpf  RBC 0-2 RBC/hpf  BACTERIA RARE   CRYSTALS NONE SEEN   CASTS NONE SEEN   Selected Results  UA With REFLEX 29Apr2015 03:10PM Jethro Bolusannenbaum, Talik Casique  SPECIMEN TYPE: CLEAN CATCH   Test Name Result Flag Reference  COLOR YELLOW  YELLOW  APPEARANCE CLEAR  CLEAR  SPECIFIC GRAVITY  1.025  1.005-1.030  pH 6.0  5.0-8.0  GLUCOSE NEG mg/dL  NEG  BILIRUBIN NEG  NEG  KETONE NEG mg/dL  NEG  BLOOD TRACE A NEG  PROTEIN NEG mg/dL  NEG  UROBILINOGEN 0.2 mg/dL  1.6-1.00.0-1.0  NITRITE NEG  NEG  LEUKOCYTE ESTERASE NEG  NEG  SQUAMOUS EPITHELIAL/HPF RARE  RARE  WBC 0-2 WBC/hpf  <3  RBC 0-2 RBC/hpf  <3  BACTERIA RARE  RARE  CRYSTALS NONE SEEN  NONE SEEN  CASTS NONE SEEN  NONE SEEN    Assessment Assessed  1. Bilateral kidney stones (592.0)  CT reviewed from yesterday. 9mmx 6mm in Right extrarenal pelvis. He has L kidney stone also. We have discussed options for removal of his Right extra-renal pelvic stone, 9mm x 6mm. He could have ureteroscopy and laser, vs percutaneous nephrolithotomy. 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    We will schedule ureteroscopy and laser of stone. He has had lithotripsy, and I do not think this stone is a good candidate for lithotripsy.   Plan Schedule ureteroscopy and laser fragmentation   Signatures Electronically signed by : Jethro BolusSigmund Jenaveve Fenstermaker, M.D.; Nov 02 2013  4:18PM EST

## 2013-11-04 NOTE — Discharge Instructions (Signed)
Kidney Stones °Kidney stones (urolithiasis) are solid masses that form inside your kidneys. The intense pain is caused by the stone moving through the kidney, ureter, bladder, and urethra (urinary tract). When the stone moves, the ureter starts to spasm around the stone. The stone is usually passed in your pee (urine).  °HOME CARE °· Drink enough fluids to keep your pee clear or pale yellow. This helps to get the stone out. °· Strain all pee through the provided strainer. Do not pee without peeing through the strainer, not even once. If you pee the stone out, catch it in the strainer. The stone may be as small as a grain of salt. Take this to your doctor. This will help your doctor figure out what you can do to try to prevent more kidney stones. °· Only take medicine as told by your doctor. °· Follow up with your doctor as told. °· Get follow-up X-rays as told by your doctor. °GET HELP IF: °You have pain that gets worse even if you have been taking pain medicine. °GET HELP RIGHT AWAY IF:  °· Your pain does not get better with medicine. °· You have a fever or shaking chills. °· Your pain increases and gets worse over 18 hours. °· You have new belly (abdominal) pain. °· You feel faint or pass out. °· You are unable to pee. °MAKE SURE YOU:  °· Understand these instructions. °· Will watch your condition. °· Will get help right away if you are not doing well or get worse. °Document Released: 12/10/2007 Document Revised: 02/23/2013 Document Reviewed: 11/24/2012 °ExitCare® Patient Information ©2014 [employer]. ° °Diet for Kidney Stones °Kidney stones are small, hard masses that form inside your kidneys. They are made up of salts and minerals and often form when high levels build up in the urine. The minerals can then start to build up, crystalize, and stick together to form stones. There are several different types of kidney stones. The following types of stones may be influenced by dietary factors:  °· Calcium Oxalate  Stones. An oxalate is a salt found in certain foods. Within the body, calcium can combine with oxalates to form calcium oxalate stones, which can be excreted in the urine in high amounts. This is the most common type of kidney stone. °· Calcium Phosphate Stones. These stones may occur when the pH of the urine becomes too high, or less acidic, from too much calcium being excreted in the urine. The pH is a measure of how acidic or basic a substance is. °· Uric Acid Stones. This type of stone occurs when the pH of the urine becomes too low, or very acidic, because substances called purines build up in the urine. Purines are found in animal proteins. When the urine is highly concentrated with acid, uric acid kidney stones can form.   °Other risk factors for kidney stones include genetics, environment, and being overweight. Your caregiver may ask you to follow specific diet guidelines based on the type of stone you have to lessen the chances of your body making more kidney stones.  °GENERAL GUIDELINES FOR ALL TYPES OF STONES °· Drink plenty of fluid. Drink 12 16 cups of fluid a day, drinking mainly water. This is the most important thing you can do to prevent the formation of future kidney stones. °· Maintain a healthy weight. Your caregiver or dietitian can help you determine what a healthy weight is for you. If you are overweight, weight loss may help prevent the formation of future   kidney stones. °· Eat a diet adequate in animal protein. Too much animal protein can contribute to the formation of stones. Your dietitian can help you determine how much protein you should be eating. Avoid low carbohydrate, high protein diets. °· Follow a balanced eating approach. The DASH diet, which stands for "Dietary Approaches to Stop Hypertension," is an effective meal plan for reducing stone formation. This diet is high in fruits, vegetables, dairy, and whole grains and low in animal protein. Ask your caregiver or dietitian for  information about the DASH diet. °ADDITIONAL DIET GUIDELINES FOR CALCIUM STONES °Avoid foods high in salt. This includes table salt, salt seasonings, MSG, soy sauce, cured and processed meats, salted crackers and snack foods, fast food, and canned soups and foods. Ask your caregiver or dietitian for information about reducing sodium in your diet or following the low sodium diet.  °Ensure adequate calcium intake. Use the following table for calcium guidelines: °· Men 65 years old and younger  1000 mg/day. °· Men 65 years old and older  1500 mg/day. °· Women 25 27 years old  1000 mg/day. °· Women 50 years and older  1500 mg/day. °Your dietitian can help you determine if you are getting enough calcium in your diet. Foods that are high in calcium include dairy products, broccoli, cheese, yogurt, and pudding. If you need to take a calcium supplement, take it only in the form of calcium citrate.  °Avoid foods high in oxalate. Be sure that any supplements you take do not contain more than 500 mg of vitamin C. Vitamin C is converted into oxalate in the body. You do not need to avoid fruits and vegetables high in vitamin C.  °· Grains: High-fiber or bran cereal, whole-wheat bread, grits, barley, buckwheat, amaranth, pretzels, and fruitcake. °· Vegetables: Dried beans, wax beans, dark leafy greens, eggplant, leeks, okra, parsley, rutabaga, tomato paste, watercress, zucchini, and escarole. °· Fruit: Dried apricots, red currants, figs, kiwi, and rhubarb. °· Meat and Meat Substitutes: Soybeans and foods made from soy (soyburger, miso), dried beans, peanut butter. °· Milk: Chocolate milk mixes and soymilk. °· Fats and Oils: Nuts (peanuts, almonds, pecans, cashews, hazelnuts) and nut butters, sesame seeds, and tDahini paste. °· Condiments/Miscellaneous: Chocolate, carob, marmalade, poppy seeds, instant iced tea, and juice from high-oxalate fruits.    °Document Released: 10/18/2010 Document Revised: 12/23/2011 Document Reviewed:  12/08/2011 °ExitCare® Patient Information ©2014 [employer]. ° °

## 2013-11-04 NOTE — Transfer of Care (Signed)
Immediate Anesthesia Transfer of Care Note  Patient: Kyle Mata  Procedure(s) Performed: Procedure(s): CYSTOSCOPY, RETROGRADE, URETEROSCOPY, RIGHT STENT PLACEMENT (Right) HOLMIUM LASER APPLICATION (Right)  Patient Location: PACU  Anesthesia Type:General  Level of Consciousness: Patient easily awoken, sedated, comfortable, cooperative, following commands, responds to stimulation.   Airway & Oxygen Therapy: Patient spontaneously breathing, ventilating well, oxygen via simple oxygen mask.  Post-op Assessment: Report given to PACU RN, vital signs reviewed and stable, moving all extremities.   Post vital signs: Reviewed and stable.  Complications: No apparent anesthesia complications

## 2013-11-04 NOTE — Interval H&P Note (Signed)
History and Physical Interval Note:  11/04/2013 12:56 PM  Kyle Mata  has presented today for surgery, with the diagnosis of Right Renal Pelvic Stone  The various methods of treatment have been discussed with the patient and family. After consideration of risks, benefits and other options for treatment, the patient has consented to  Procedure(s): CYSTOSCOPY, RETROGRADE, URETEROSCOPY, RIGHT STENT PLACEMENT (Right) HOLMIUM LASER APPLICATION (Right) as a surgical intervention .  The patient's history has been reviewed, patient examined, no change in status, stable for surgery.  I have reviewed the patient's chart and labs.  Questions were answered to the patient's satisfaction.     Kathi LudwigSigmund I Gennavieve Huq

## 2013-11-04 NOTE — Anesthesia Preprocedure Evaluation (Signed)
Anesthesia Evaluation  Patient identified by MRN, date of birth, ID band Patient awake    Reviewed: Allergy & Precautions, H&P , NPO status , Patient's Chart, lab work & pertinent test results  Airway Mallampati: II TM Distance: >3 FB Neck ROM: Full    Dental  (+) Teeth Intact, Dental Advisory Given   Pulmonary neg pulmonary ROS,  breath sounds clear to auscultation  Pulmonary exam normal       Cardiovascular negative cardio ROS  Rhythm:Regular Rate:Normal     Neuro/Psych negative neurological ROS  negative psych ROS   GI/Hepatic negative GI ROS, Neg liver ROS,   Endo/Other  negative endocrine ROS  Renal/GU negative Renal ROS  negative genitourinary   Musculoskeletal negative musculoskeletal ROS (+)   Abdominal   Peds  Hematology negative hematology ROS (+)   Anesthesia Other Findings   Reproductive/Obstetrics                           Anesthesia Physical Anesthesia Plan  ASA: I  Anesthesia Plan: General   Post-op Pain Management:    Induction: Intravenous  Airway Management Planned: LMA  Additional Equipment:   Intra-op Plan:   Post-operative Plan: Extubation in OR  Informed Consent: I have reviewed the patients History and Physical, chart, labs and discussed the procedure including the risks, benefits and alternatives for the proposed anesthesia with the patient or authorized representative who has indicated his/her understanding and acceptance.   Dental advisory given  Plan Discussed with: CRNA  Anesthesia Plan Comments:         Anesthesia Quick Evaluation

## 2013-11-04 NOTE — Interval H&P Note (Signed)
History and Physical Interval Note:  11/04/2013 12:57 PM  Kyle Mata  has presented today for surgery, with the diagnosis of Right Renal Pelvic Stone  The various methods of treatment have been discussed with the patient and family. After consideration of risks, benefits and other options for treatment, the patient has consented to  Procedure(s): CYSTOSCOPY, RETROGRADE, URETEROSCOPY, RIGHT STENT PLACEMENT (Right) HOLMIUM LASER APPLICATION (Right) as a surgical intervention .  The patient's history has been reviewed, patient examined, no change in status, stable for surgery.  I have reviewed the patient's chart and labs.  Questions were answered to the patient's satisfaction.     Kathi LudwigSigmund I Tyse Auriemma

## 2013-11-04 NOTE — Op Note (Signed)
Pre-operative diagnosis :   Right lower pole stone  Postoperative diagnosis:  Same plus right upper pole stone  Operation:  Cystourethroscopy, right retropyelogram interpretation, right flexible digital ureteroscopy with laser fragmentation and basket extraction of upper pole stone, and right lower pole stone with double-J stent placement (6 [place] by 26 cm stent in (.  Surgeon:  S. Patsi Searsannenbaum, MD  First assistant:  None  Anesthesia:  General endotracheal  Preparation:  After appropriate preanesthesia, the patient is brought the operating room, placed in the upper table in dorsal supine position where general endotracheal anesthesia was introduced. He was then replaced in the dorsal lithotomy position with pubis was prepped with Betadine solution draped in usual fashion. The right arm had recently been marked. The CT was reviewed. History was reviewed.  Review history:  Active Problems  Problems  1. Bilateral kidney stones (592.0)  History of Present Illness  28 yo male returns today for a 6 mo f/u & to review 24hr urine results with a hx of bilateral kidney stones. He had a CT w/ abd/pelvis yesterday at [facility] for hx of Rt flank pain. He is s/p ESWL Sept 8th, 2014 of Right UPJ stone.  He drinks 1-3 caffeinated drinks per week.  Past Medical History  Problems  1. History of Anxiety (300.00)  2. History of esophageal reflux (V12.79)  3. History of kidney stones (V13.01)    Statement of  Likelihood of Success: Excellent. TIME-OUT observed.:  Procedure:  Cystourethroscopy was accomplished and showed normal penis. The bladder base was normal and clear reflux is seen from both cortices. Right retrograde Pyelogram was performed showed normal ureter without hydronephrosis. There appeared to be stone in the right lower pole. A 0.038 guidewire was then passed into the renal pelvis. A second guidewire was passed in the renal pelvis and the cystoscope was removed. The median length ureteral  sheath was placed, and through this, the digital flexible ureteroscope was placed. Pyeloscopy was performed, which showed upper pole stone. Laser fragmentation was accomplished at settings of 0.5 and 10. This was then basket extracted. Further ureteroscopy revealed lower pole stone. I was unable to fragment the stone using the 325  fiber. Therefore, changed to the 200  fiber, and was able to fragment the lower pole stone and basket extract large portion. A 6 French by 26 cm double-J stent was then passed into the right renal pelvis under fluoroscopic control. The patient tolerated well. He received a B. and O. suppository, as well as IV Toradol, and IV Tylenol. He tolerated procedure well. He was awakened and taken to recovery room in good condition.

## 2013-11-04 NOTE — Progress Notes (Addendum)
Called Dr Imelda Pillowannenbaum's office to clarify antiobiotic ordered for post op. Prescription reads trimethoprim 100 mg po for one dose. Writer speaks with his RN, Selena BattenKim. Writer asks if this is for dose prior to stent coming out. She will clarify with Dr Patsi Searsannenbaum and call back.  1530  Kim RN from Dr Imelda Pillowannenbaum's office called back. She will call in prescription for Trimethoprim 100 mg PO for 7 days to [facility] [place], [place]

## 2013-11-05 ENCOUNTER — Emergency Department (HOSPITAL_COMMUNITY)
Admission: EM | Admit: 2013-11-05 | Discharge: 2013-11-05 | Disposition: A | Discharge status: Home / Self Care | Payer: BC Managed Care – PPO | Attending: Emergency Medicine | Admitting: Emergency Medicine

## 2013-11-05 ENCOUNTER — Encounter (HOSPITAL_COMMUNITY): Payer: Self-pay | Admitting: Emergency Medicine

## 2013-11-05 DIAGNOSIS — IMO0002 Reserved for concepts with insufficient information to code with codable children: Secondary | ICD-10-CM | POA: Insufficient documentation

## 2013-11-05 DIAGNOSIS — R339 Retention of urine, unspecified: Secondary | ICD-10-CM | POA: Insufficient documentation

## 2013-11-05 DIAGNOSIS — Z79899 Other long term (current) drug therapy: Secondary | ICD-10-CM | POA: Insufficient documentation

## 2013-11-05 DIAGNOSIS — N9989 Other postprocedural complications and disorders of genitourinary system: Secondary | ICD-10-CM | POA: Insufficient documentation

## 2013-11-05 DIAGNOSIS — Y838 Other surgical procedures as the cause of abnormal reaction of the patient, or of later complication, without mention of misadventure at the time of the procedure: Secondary | ICD-10-CM | POA: Insufficient documentation

## 2013-11-05 DIAGNOSIS — Z87442 Personal history of urinary calculi: Secondary | ICD-10-CM | POA: Insufficient documentation

## 2013-11-05 LAB — URINALYSIS, ROUTINE W REFLEX MICROSCOPIC
Bilirubin Urine: NEGATIVE
Glucose, UA: NEGATIVE mg/dL
KETONES UR: NEGATIVE mg/dL
Nitrite: NEGATIVE
PROTEIN: 30 mg/dL — AB
Specific Gravity, Urine: 1.01 (ref 1.005–1.030)
Urobilinogen, UA: 0.2 mg/dL (ref 0.0–1.0)
pH: 7 (ref 5.0–8.0)

## 2013-11-05 LAB — URINE MICROSCOPIC-ADD ON

## 2013-11-05 MED ORDER — TAMSULOSIN HCL 0.4 MG PO CAPS: 0.4000 mg | ORAL_CAPSULE | Freq: Every day | ORAL | Status: DC

## 2013-11-05 MED ORDER — TAMSULOSIN HCL 0.4 MG PO CAPS
0.4000 mg | ORAL_CAPSULE | Freq: Every day | ORAL | Status: DC
  Administered 2013-11-05: 0.4 mg via ORAL
  Filled 2013-11-05: qty 1

## 2013-11-05 NOTE — ED Notes (Signed)
Bladder scan showed volume>[id number]

## 2013-11-05 NOTE — Discharge Instructions (Signed)
Acute Urinary Retention, Male °Acute urinary retention is the temporary inability to urinate. °This is a common problem in older men. As men age their prostates become larger and block the flow of urine from the bladder. This is usually a problem that has come on gradually.  °HOME CARE INSTRUCTIONS °If you are sent home with a Foley catheter and a drainage system, you will need to discuss the best course of action with your health care provider. While the catheter is in, maintain a good intake of fluids. Keep the drainage bag emptied and lower than your catheter. This is so that contaminated urine will not flow back into your bladder, which could lead to a urinary tract infection. °There are two main types of drainage bags. One is a large bag that usually is used at night. It has a good capacity that will allow you to sleep through the night without having to empty it. The second type is called a leg bag. It has a smaller capacity, so it needs to be emptied more frequently. However, the main advantage is that it can be attached by a leg strap and can go underneath your clothing, allowing you the freedom to move about or leave your home. °Only take over-the-counter or prescription medicines for pain, discomfort, or fever as directed by your health care provider.  °SEEK MEDICAL CARE IF: °· You develop a low-grade fever. °· You experience spasms or leakage of urine with the spasms. °SEEK IMMEDIATE MEDICAL CARE IF:  °· You develop chills or fever. °· Your catheter stops draining urine. °· Your catheter falls out. °· You start to develop increased bleeding that does not respond to rest and increased fluid intake. °MAKE SURE YOU: °· Understand these instructions. °· Will watch your condition. °· Will get help right away if you are not doing well or get worse. °Document Released: 09/29/2000 Document Revised: 02/23/2013 Document Reviewed: 12/02/2012 °ExitCare® Patient Information ©2014 [employer]. ° °

## 2013-11-05 NOTE — ED Provider Notes (Signed)
CSN: [id number]     Arrival date & time 11/05/13  0124 History   First MD Initiated Contact with Patient 11/05/13 [phone]     Chief Complaint  Patient presents with  . Urinary Retention     (Consider location/radiation/quality/duration/timing/severity/associated sxs/prior Treatment) HPI 28 year old male presents emergency department with complaint of inability to urinate.  Patient reports since around 10 PM he has had decreased urine, and the last time he urinated urine was foaming.  Patient had cystogram with laser fragmentation of his kidney stones yesterday.  He reports pain is well controlled by his medications.  He is having some lower abdominal distention and pressure.  He denies any fever or chills, no nausea or vomiting.  Bladder scan done by nursing staff prior to my evaluation showed greater than 900 mL's.  Foley catheter placed with return of urine.  Patient reports he is feeling better. Past Medical History  Diagnosis Date  . Kidney stone    Past Surgical History  Procedure Laterality Date  . Lithotripsy     No family history on file. History  Substance Use Topics  . Smoking status: Never Smoker   . Smokeless tobacco: Not on file  . Alcohol Use: No    Review of Systems   See History of Present Illness; otherwise all other systems are reviewed and negative  Allergies  Sulfa antibiotics  Home Medications   Prior to Admission medications   Medication Sig Start Date End Date Taking? Authorizing Provider  oxyCODONE-acetaminophen (ROXICET) 5-325 MG per tablet Take 1 tablet by mouth every 4 (four) hours as needed for severe pain. 11/04/13  Yes Kathi LudwigSigmund I Tannenbaum, MD  trimethoprim (TRIMPEX) 100 MG tablet Take 100 mg by mouth daily.   Yes Historical Provider, MD  methenamine-sodium biphosphonate (UROQUID) 500-500 MG per tablet Take 1 tablet by mouth 4 (four) times daily. 11/04/13   Kathi LudwigSigmund I Tannenbaum, MD  tamsulosin (FLOMAX) 0.4 MG CAPS capsule Take 1 capsule (0.4 mg total)  by mouth daily. 11/05/13   Olivia Mackielga M Tamyka Bezio, MD   BP 142/71  Pulse 82  Temp(Src) 98.6 F (37 C) (Oral)  Resp 16  SpO2 96% Physical Exam  Nursing note and vitals reviewed. Constitutional: He is oriented to person, place, and time. He appears well-developed and well-nourished. No distress.  HENT:  Head: Normocephalic and atraumatic.  Nose: Nose normal.  Mouth/Throat: Oropharynx is clear and moist.  Eyes: Conjunctivae and EOM are normal. Pupils are equal, round, and reactive to light.  Neck: Normal range of motion. Neck supple. No JVD present. No tracheal deviation present. No thyromegaly present.  Cardiovascular: Normal rate, regular rhythm, normal heart sounds and intact distal pulses.  Exam reveals no gallop and no friction rub.   No murmur heard. Pulmonary/Chest: Effort normal and breath sounds normal. No stridor. No respiratory distress. He has no wheezes. He has no rales. He exhibits no tenderness.  Abdominal: Soft. Bowel sounds are normal. He exhibits no distension and no mass. There is no tenderness. There is no rebound and no guarding.  Genitourinary:  Foley catheter in place draining yellow urine  Musculoskeletal: Normal range of motion. He exhibits no edema and no tenderness.  Lymphadenopathy:    He has no cervical adenopathy.  Neurological: He is alert and oriented to person, place, and time. He exhibits normal muscle tone. Coordination normal.  Skin: Skin is dry. No rash noted. No erythema. No pallor.  Psychiatric: His behavior is normal. Judgment and thought content normal.  Patient is very odd  flat affect, apologizing for coming to the emergency department and not wanting to bother us.    ED Course  Procedures (including critical care time) Labs Review Labs Reviewed  URINALYSIS, ROUTINE W REFLEX MICROSCOPIC - Abnormal; Notable for the following:    APPearance CLOUDY (*)    Hgb urine dipstick LARGE (*)    Protein, ur 30 (*)    Leukocytes, UA SMALL (*)    All other  components within normal limits  URINE MICROSCOPIC-ADD ON    Imaging Review No results found.   EKG Interpretation None      MDM   Final diagnoses:  Urinary retention    28 year old male with urinary retention after cystoscopy earlier today and kidney stone removal.  We'll have him followup with his urologist this week.    Olivia Mackielga M Roan Sawchuk, MD 11/05/13 [phone]

## 2013-11-05 NOTE — ED Notes (Addendum)
Pt presents with c/o urinary retention, pain and "foaming" urine. Pt has cysto with laser fragmentation of kidney stones and basket removal and stent placement yesterday at this facility by Dr. Patsi Searsannenbaum.

## 2013-11-06 NOTE — Anesthesia Postprocedure Evaluation (Signed)
  Anesthesia Post-op Note  Patient: Kyle Mata L Hangartner  Procedure(s) Performed: Procedure(s) (LRB): CYSTOSCOPY, RETROGRADE, URETEROSCOPY, RIGHT STENT PLACEMENT (Right) HOLMIUM LASER APPLICATION (Right)  Patient Location: PACU  Anesthesia Type: General  Level of Consciousness: awake and alert   Airway and Oxygen Therapy: Patient Spontanous Breathing  Post-op Pain: mild  Post-op Assessment: Post-op Vital signs reviewed, Patient's Cardiovascular Status Stable, Respiratory Function Stable, Patent Airway and No signs of Nausea or vomiting  Last Vitals:  Filed Vitals:   11/04/13 1525  BP: 135/65  Pulse: 63  Temp: 36.8 C  Resp: 14    Post-op Vital Signs: stable   Complications: No apparent anesthesia complications

## 2013-11-08 ENCOUNTER — Encounter (HOSPITAL_COMMUNITY): Payer: Self-pay | Admitting: Urology

## 2013-12-28 ENCOUNTER — Ambulatory Visit (INDEPENDENT_AMBULATORY_CARE_PROVIDER_SITE_OTHER): Payer: Self-pay | Admitting: Family Medicine

## 2013-12-28 VITALS — BP 120/68 | HR 65 | Temp 98.0°F | Resp 18 | Ht 72.5 in | Wt 193.0 lb

## 2013-12-28 DIAGNOSIS — N200 Calculus of kidney: Secondary | ICD-10-CM

## 2013-12-28 DIAGNOSIS — Z0000 Encounter for general adult medical examination without abnormal findings: Secondary | ICD-10-CM

## 2013-12-28 NOTE — Progress Notes (Signed)
[facility] [address], [place] [address] [phone]  Date:  12/28/2013   Name:  Kyle Mata Palmateer   DOB:  January 19, 1986   MRN:  [id number]  PCP:  No PCP Per Patient    Chief Complaint: Annual Exam   History of Present Illness:  Kyle Mata Kabir is a 28 y.o. very pleasant male patient who presents with the following:  Here today for a DOT exam, he is generally quite healthy except for a history of kidney stone treated by cystoscopy last month.  He is now better from this incident, his stent is now out.  His urologist is Dr[facility] Pats[facility]    There are no active problems to display for this patient.   Past Medical History  Diagnosis Date  . Kidney stone     Past Surgical History  Procedure Laterality Date  . Lithotripsy    . Cystoscopy/retrograde/ureteroscopy Right 11/04/2013    Procedure: CYSTOSCOPY, RETROGRADE, URETEROSCOPY, RIGHT STENT PLACEMENT;  Surgeon: Kathi LudwigSigmund I Tannenbaum, MD;  Location: [facility];  Service: Urology;  Laterality: Right;  . Holmium laser application Right 11/04/2013    Procedure: HOLMIUM LASER APPLICATION;  Surgeon: Kathi LudwigSigmund I Tannenbaum, MD;  Location: [facility];  Service: Urology;  Laterality: Right;    History  Substance Use Topics  . Smoking status: Never Smoker   . Smokeless tobacco: Not on file  . Alcohol Use: No    No family history on file.  Allergies  Allergen Reactions  . Sulfa Antibiotics     Childhood allergy    Medication list has been reviewed and updated.  No current outpatient prescriptions on file prior to visit.   No current facility-administered medications on file prior to visit.    Review of Systems:  As per HPI- otherwise negative.   Physical Examination: Filed Vitals:   12/28/13 0906  BP: 120/68  Pulse: 65  Temp: 98 F (36.7 C)  Resp: 18   Filed Vitals:   12/28/13 0906  Height: 6' 0.5" (1.842 m)  Weight: 193 lb (87.544 kg)   Body mass index is 25.8 kg/(m^2). Ideal  Body Weight: Weight in (lb) to have BMI = 25: 186.5  GEN: WDWN, NAD, Non-toxic, A & O x 3, well appearing  HEENT: Atraumatic, Normocephalic. Neck supple. No masses, No LAD.  Bilateral TM wnl, oropharynx normal.  PEERL,EOMI.   Ears and Nose: No external deformity. CV: RRR, No M/G/R. No JVD. No thrill. No extra heart sounds. PULM: CTA B, no wheezes, crackles, rhonchi. No retractions. No resp. distress. No accessory muscle use. ABD: S, NT, ND. No rebound. No HSM. EXTR: No c/c/e NEURO Normal gait. Normal strength and DTR all extremities  PSYCH: Normally interactive. Conversant. Not depressed or anxious appearing.  Calm demeanor.  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

## 2013-12-28 NOTE — Patient Instructions (Signed)
Great to see you today- you qualify for a 2 year card

## 2015-06-25 IMAGING — CT CT ABD-PELV W/ CM
2 of 4 series · 16 of 46 positions shown, 18 images · IV contrast (40CC OMNI & H2O & [ID] OMNI 300)
Comparison: KUB of 04/29/2013

CLINICAL DATA: Right lower quadrant pain, some rectal discomfort
for several months

EXAM:
CT ABDOMEN AND PELVIS WITH CONTRAST
TECHNIQUE: Multidetector CT imaging of the abdomen and pelvis was performed
using the standard protocol following bolus administration of
intravenous contrast.
CONTRAST:  125mL OMNIPAQUE IOHEXOL 300 MG/ML SOLN, 40mL OMNIPAQUE
IOHEXOL 300 MG/ML SOLN

[Series 2: abd/pelvis with · axial · 0.68mm/px · z∈[-479,-44]mm · 13 of 94 slices shown, 15 images]
[im 5/94  soft-tissue]
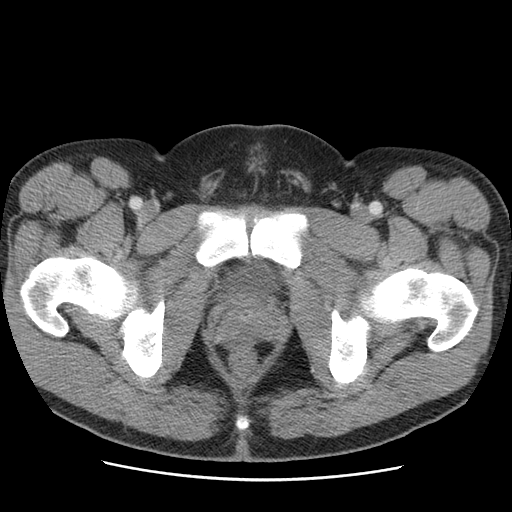
[im 5/94  bone]
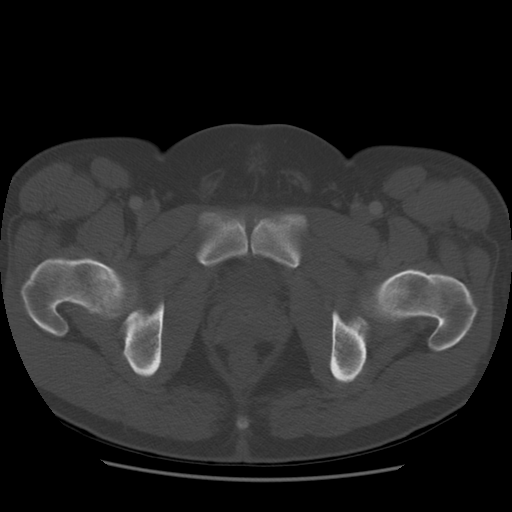
[im 13/94  soft-tissue]
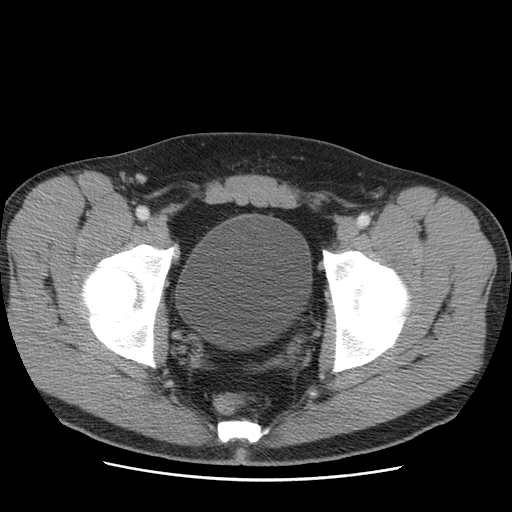
[im 21/94  soft-tissue]
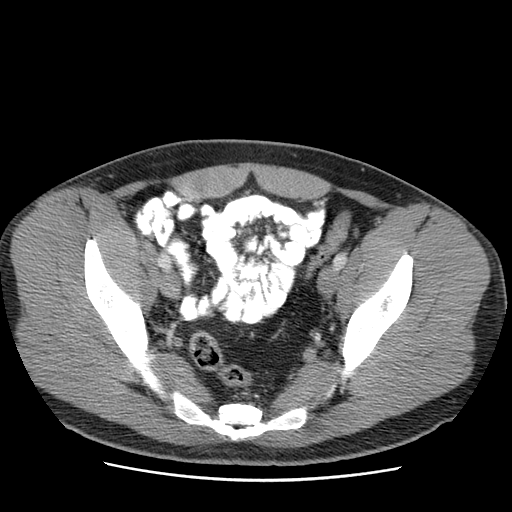
[im 25/94  soft-tissue]
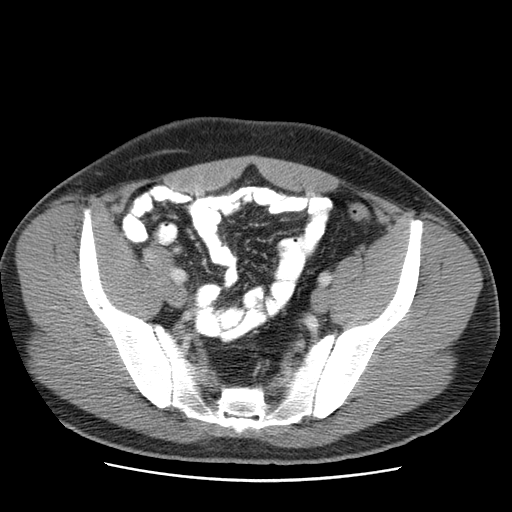
[im 33/94  soft-tissue]
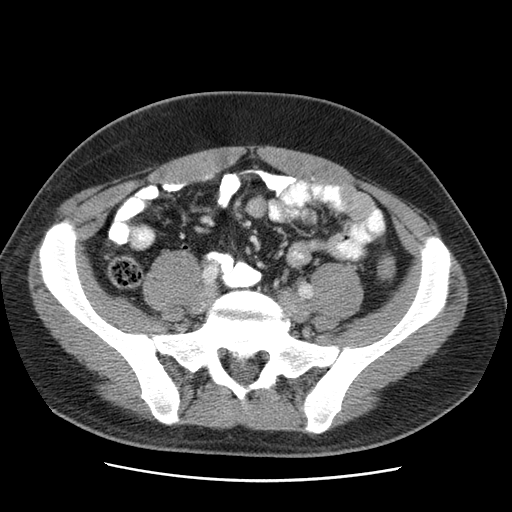
[im 41/94  soft-tissue]
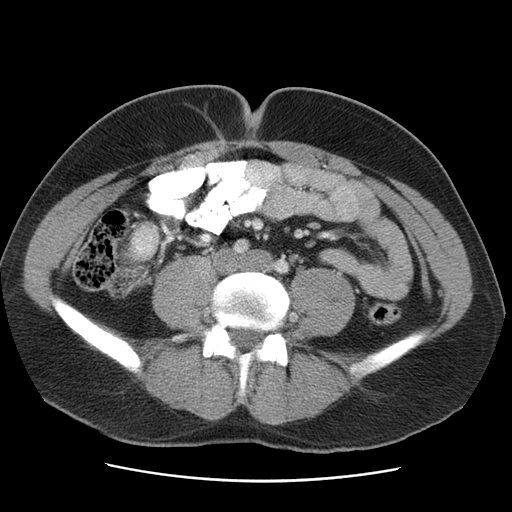
[im 49/94  soft-tissue]
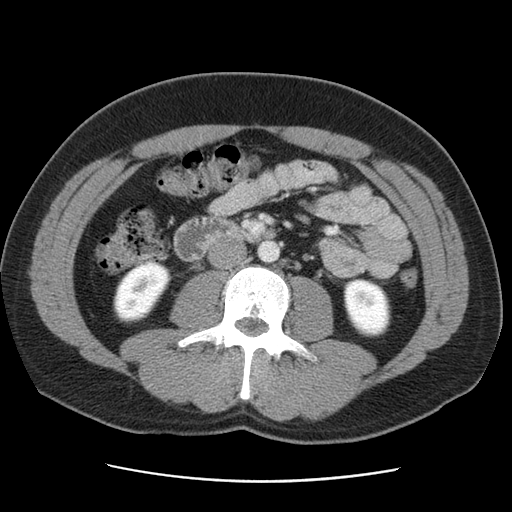
[im 53/94  soft-tissue]
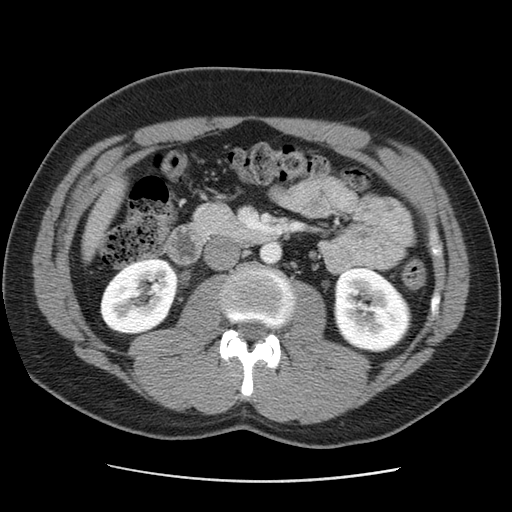
[im 61/94  soft-tissue]
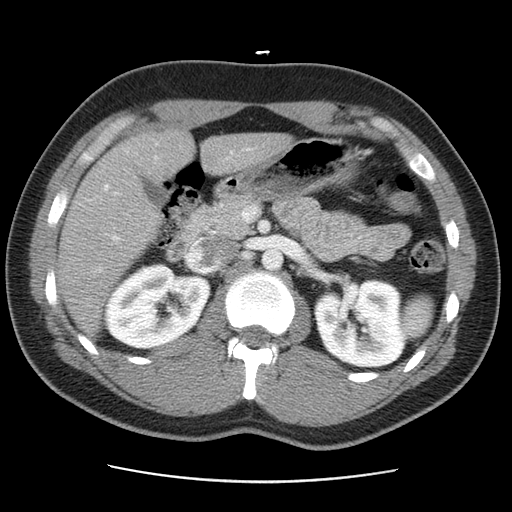
[im 61/94  bone]
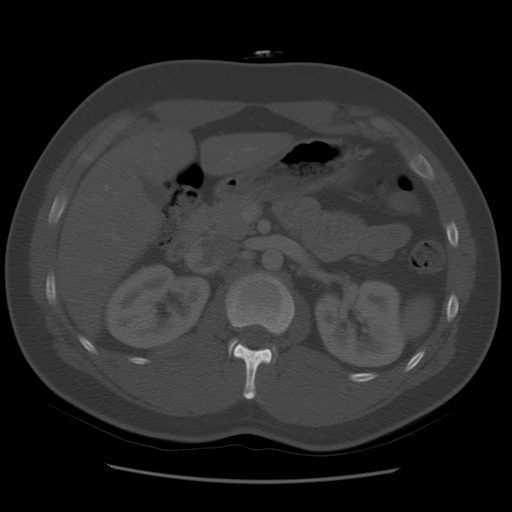
[im 69/94  soft-tissue]
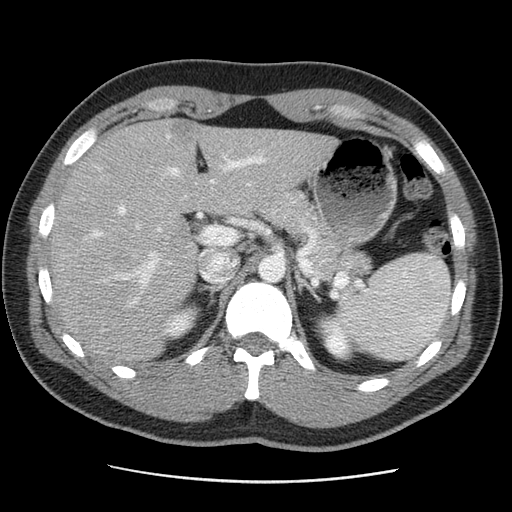
[im 73/94  soft-tissue]
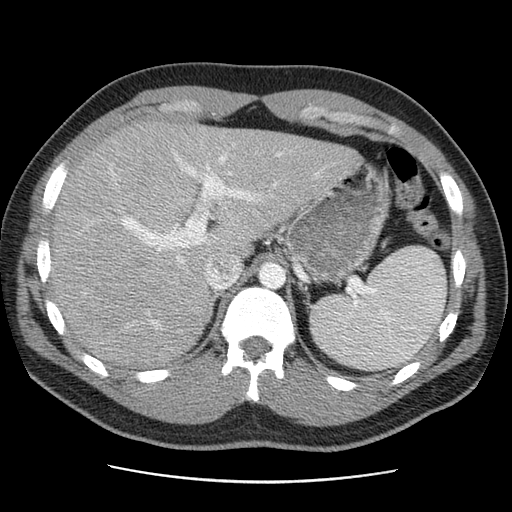
[im 81/94  soft-tissue]
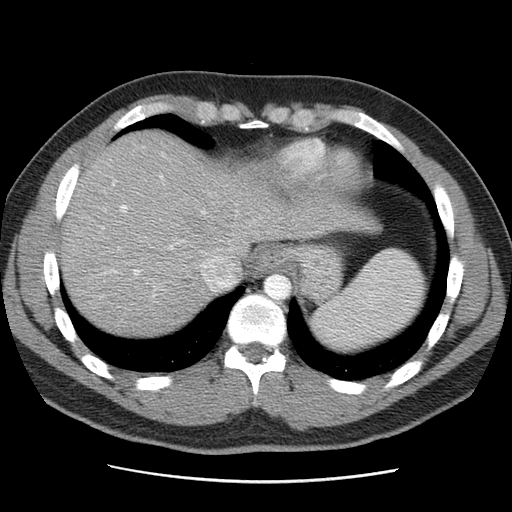
[im 89/94  soft-tissue]
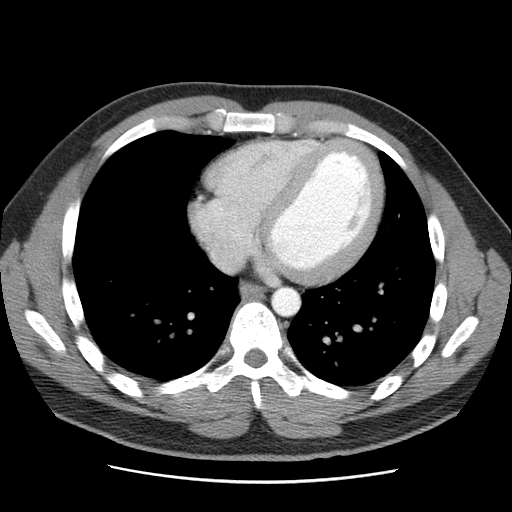

[Series 400: cor · coronal · 1.05mm/px · 3 of 134 slices shown]
[im 45/134  soft-tissue]
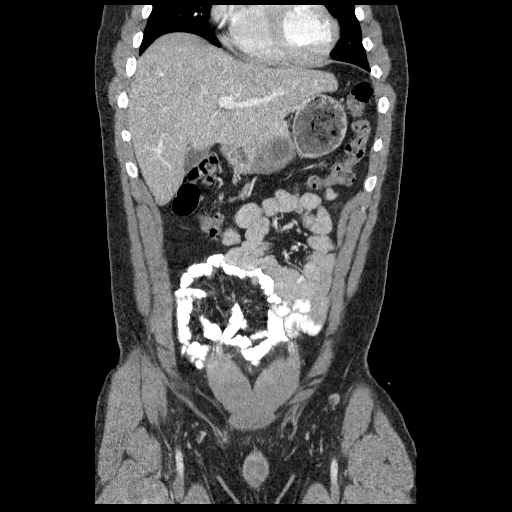
[im 60/134  soft-tissue]
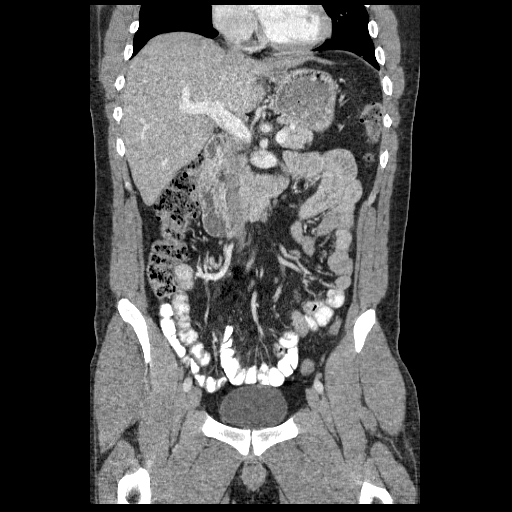
[im 74/134  soft-tissue]
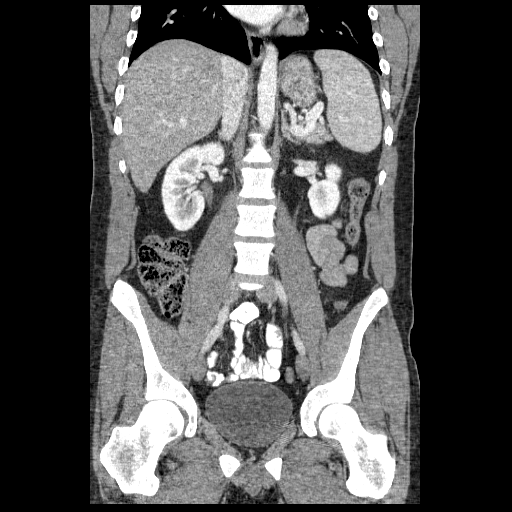

[16 of 46 positions shown; findings below may reference images not displayed]

FINDINGS: The lung bases are clear. The liver enhances with no focal
abnormality and no ductal dilatation is seen. No calcified
gallstones are noted. The pancreas is normal in size and the
pancreatic duct is not dilated. The adrenal glands and spleen are
unremarkable. The stomach is not well distended but no abnormality
is seen. The kidneys enhance and there are bilateral renal calculi
present. A calculus of 9 x 6 mm appears to layer dependently within
the slightly prominent right renal pelvis, and may be intermittently
partially obstructing. Additional calculi are present bilaterally
with the largest of 8 mm in the upper pole of the posterior left
kidney, best seen on coronal images. The ureters are normal in
caliber. The abdominal aorta appears normal. No adenopathy is seen.

The distal ureters are normal caliber and no distal ureteral
calculus is seen. The urinary bladder is moderately well distended
with no abnormality noted. The prostate is normal in size. The colon
is largely decompressed. The terminal ileum and appendix are
unremarkable. The lumbar vertebrae are in normal alignment. Very
mild degenerative change is present the L5-S1 level with posterior
osteophytes noted at that level.
IMPRESSION: 1. Bilateral renal calculi. A 9 x 6 mm calculus appears to layer
dependently within the slightly prominent right renal pelvis and may
be intermittently partially obstructing.
2. No ureteral calculi are seen.
3. The appendix and terminal ileum are unremarkable.
4. Mild degenerative change at L5-S1

## 2016-02-20 ENCOUNTER — Other Ambulatory Visit: Payer: Self-pay | Admitting: Urology

## 2016-02-20 ENCOUNTER — Other Ambulatory Visit (HOSPITAL_COMMUNITY): Payer: Self-pay | Admitting: Urology

## 2016-02-20 DIAGNOSIS — N200 Calculus of kidney: Secondary | ICD-10-CM

## 2016-04-08 ENCOUNTER — Encounter (HOSPITAL_COMMUNITY): Payer: Self-pay | Admitting: *Deleted

## 2016-04-11 ENCOUNTER — Other Ambulatory Visit: Payer: Self-pay | Admitting: General Surgery

## 2016-04-14 ENCOUNTER — Ambulatory Visit (HOSPITAL_COMMUNITY)
Admission: RE | Admit: 2016-04-14 | Discharge: 2016-04-14 | Disposition: A | Discharge status: Home / Self Care | Payer: Self-pay | Source: Ambulatory Visit | Attending: Urology | Admitting: Urology

## 2016-04-14 ENCOUNTER — Encounter (HOSPITAL_COMMUNITY): Payer: Self-pay

## 2016-04-14 DIAGNOSIS — N200 Calculus of kidney: Secondary | ICD-10-CM | POA: Insufficient documentation

## 2016-04-14 DIAGNOSIS — Z87440 Personal history of urinary (tract) infections: Secondary | ICD-10-CM | POA: Insufficient documentation

## 2016-04-14 DIAGNOSIS — F419 Anxiety disorder, unspecified: Secondary | ICD-10-CM | POA: Insufficient documentation

## 2016-04-14 HISTORY — PX: NEPHROSTOMY: SHX1014

## 2016-04-14 HISTORY — PX: IR GENERIC HISTORICAL: IMG1180011

## 2016-04-14 LAB — PROTIME-INR
INR: 0.96
Prothrombin Time: 12.8 seconds (ref 11.4–15.2)

## 2016-04-14 LAB — CBC WITH DIFFERENTIAL/PLATELET
Basophils Absolute: 0 10*3/uL (ref 0.0–0.1)
Basophils Relative: 0 %
Eosinophils Absolute: 0.2 10*3/uL (ref 0.0–0.7)
Eosinophils Relative: 2 %
HEMATOCRIT: 49.4 % (ref 39.0–52.0)
HEMOGLOBIN: 17.3 g/dL — AB (ref 13.0–17.0)
LYMPHS ABS: 3 10*3/uL (ref 0.7–4.0)
Lymphocytes Relative: 30 %
MCH: 30.1 pg (ref 26.0–34.0)
MCHC: 35 g/dL (ref 30.0–36.0)
MCV: 85.9 fL (ref 78.0–100.0)
MONO ABS: 0.9 10*3/uL (ref 0.1–1.0)
MONOS PCT: 9 %
NEUTROS ABS: 6 10*3/uL (ref 1.7–7.7)
NEUTROS PCT: 59 %
Platelets: 236 10*3/uL (ref 150–400)
RBC: 5.75 MIL/uL (ref 4.22–5.81)
RDW: 13 % (ref 11.5–15.5)
WBC: 10.2 10*3/uL (ref 4.0–10.5)

## 2016-04-14 LAB — APTT: aPTT: 29 seconds (ref 24–36)

## 2016-04-14 MED ORDER — HYDROCODONE-ACETAMINOPHEN 5-325 MG PO TABS
1.0000 | ORAL_TABLET | ORAL | Status: DC | PRN
  Administered 2016-04-14: 1 via ORAL
  Filled 2016-04-14 (×2): qty 1

## 2016-04-14 MED ORDER — LIDOCAINE HCL 1 % IJ SOLN
INTRAMUSCULAR | Status: AC
  Filled 2016-04-14: qty 20

## 2016-04-14 MED ORDER — ONDANSETRON HCL 4 MG/2ML IJ SOLN
4.0000 mg | INTRAMUSCULAR | Status: AC
  Administered 2016-04-14: 4 mg via INTRAVENOUS
  Filled 2016-04-14: qty 2

## 2016-04-14 MED ORDER — FENTANYL CITRATE (PF) 100 MCG/2ML IJ SOLN
INTRAMUSCULAR | Status: AC | PRN
  Administered 2016-04-14 (×3): 25 ug via INTRAVENOUS
  Administered 2016-04-14: 50 ug via INTRAVENOUS

## 2016-04-14 MED ORDER — MIDAZOLAM HCL 2 MG/2ML IJ SOLN
INTRAMUSCULAR | Status: AC
  Filled 2016-04-14: qty 8

## 2016-04-14 MED ORDER — CIPROFLOXACIN IN D5W 400 MG/200ML IV SOLN: 400.0000 mg | INTRAVENOUS | Status: DC

## 2016-04-14 MED ORDER — FENTANYL CITRATE (PF) 100 MCG/2ML IJ SOLN
INTRAMUSCULAR | Status: AC
  Filled 2016-04-14: qty 4

## 2016-04-14 MED ORDER — MIDAZOLAM HCL 2 MG/2ML IJ SOLN
INTRAMUSCULAR | Status: AC | PRN
  Administered 2016-04-14 (×6): 1 mg via INTRAVENOUS

## 2016-04-14 MED ORDER — PHENAZOPYRIDINE HCL 200 MG PO TABS
200.0000 mg | ORAL_TABLET | Freq: Once | ORAL | Status: AC
  Administered 2016-04-14: 200 mg via ORAL
  Filled 2016-04-14 (×2): qty 1

## 2016-04-14 MED ORDER — SODIUM CHLORIDE 0.9 % IV SOLN
INTRAVENOUS | Status: DC
  Administered 2016-04-14: 12:00:00 via INTRAVENOUS

## 2016-04-14 MED ORDER — IOPAMIDOL (ISOVUE-300) INJECTION 61%
15.0000 mL | Freq: Once | INTRAVENOUS | Status: AC | PRN
  Administered 2016-04-14: 15 mL

## 2016-04-14 MED ORDER — CEFAZOLIN SODIUM-DEXTROSE 2-4 GM/100ML-% IV SOLN
2.0000 g | Freq: Once | INTRAVENOUS | Status: AC
  Administered 2016-04-14: 2 g via INTRAVENOUS
  Filled 2016-04-14 (×3): qty 100

## 2016-04-14 MED ORDER — LIDOCAINE HCL 1 % IJ SOLN
INTRAMUSCULAR | Status: AC | PRN
  Administered 2016-04-14: 10 mL

## 2016-04-14 NOTE — Progress Notes (Addendum)
Patient is experiencing pressure like he has to urinate. Unable to detect any urine in bladder with bladder scanner. He is encouraged to get up and ambulate in the hallway. He was able to void about 75-100 mls of urine but is still experiencing pressure. Medicated for discomfort.  1845  Patient had 300 ml of emesis. He did not keep hydrocodone with tylenol down.  Oral care given. Back to bed. Ice chips only.  [phone]  Paged Dr Deanne CofferHassell who is on call for IR. [phone]   Orders received.

## 2016-04-14 NOTE — Discharge Instructions (Signed)
Percutaneous Nephrostomy, Care After Refer to this sheet in the next few weeks. These instructions provide you with information on caring for yourself after your procedure. Your health care provider may also give you more specific instructions. Your treatment has been planned according to current medical practices, but problems sometimes occur. Call your health care provider if you have any problems or questions after your procedure. WHAT TO EXPECT AFTER THE PROCEDURE You will need to remain lying down for several hours. HOME CARE INSTRUCTIONS  Your nephrostomy tube is connected to a leg bag or bedside drainage bag. Always keep the tubing, the leg bag, or the bedside drainage bags below the level of the kidney so that the urine drains freely.  During the day, if you are connecting the nephrostomy tube to a leg bag, be sure there are no kinks in the tubing and that the urine is draining freely.  At night, you may want to connect the nephrostomy tube or the leg bag to a larger bedside drainage bag.  Change the dressing as often as directed by your health care provider, or if it becomes wet.  Gently remove the tapes and dressing from around the nephrostomy tube. Be careful not to pull on the tube while removing the dressing.  Wash the skin around the tube, rinse well, and dry.  Place two split drain sponges in and around the tube exit site.  Place tape around edge of the dressing.  Secure the nephrostomy tubing. Remember to make certain that the nephrostomy tube does not kink or become pinched closed. It can be useful to wrap any exposed tubing going from the nephrostomy tube to any of the connecting tubes to either the leg bag or drainage bag with an elastic bandage.  Every three weeks, replace the leg bag, drainage bag, and any extension tubing connected to your nephrostomy tube. Your health care provider will explain how to change the drainage bag and extension tubing. SEEK MEDICAL CARE  IF:  You experience any problems with any of the valves or tubing.  You have persistent pain or soreness in your back.  You have a fever or chills. SEEK IMMEDIATE MEDICAL CARE IF:  You have abdominal pain during the first week.  You have a new appearance of blood in your urine.  You have back pain that is not relieved by your medicine.  You have drainage, redness, swelling, or pain at the tube insertion site.  You have decreased urine output.  Your nephrostomy tube comes out.   This information is not intended to replace advice given to you by your health care provider. Make sure you discuss any questions you have with your health care provider.   Document Released: 02/14/2004 Document Revised: 07/14/2014 Document Reviewed: 02/17/2013 Elsevier Interactive Patient Education 2016 [employer] Moderate Conscious Sedation, Adult, Care After Refer to this sheet in the next few weeks. These instructions provide you with information on caring for yourself after your procedure. Your health care provider may also give you more specific instructions. Your treatment has been planned according to current medical practices, but problems sometimes occur. Call your health care provider if you have any problems or questions after your procedure. WHAT TO EXPECT AFTER THE PROCEDURE  After your procedure:  You may feel sleepy, clumsy, and have poor balance for several hours.  Vomiting may occur if you eat too soon after the procedure. HOME CARE INSTRUCTIONS  Do not participate in any activities where you could become injured for at  least 24 hours. Do not:  Drive.  Swim.  Ride a bicycle.  Operate heavy machinery.  Cook.  Use power tools.  Climb ladders.  Work from a high place.  Do not make important decisions or sign legal documents until you are improved.  If you vomit, drink water, juice, or soup when you can drink without vomiting. Make sure you have little or no nausea  before eating solid foods.  Only take over-the-counter or prescription medicines for pain, discomfort, or fever as directed by your health care provider.  Make sure you and your family fully understand everything about the medicines given to you, including what side effects may occur.  You should not drink alcohol, take sleeping pills, or take medicines that cause drowsiness for at least 24 hours.  If you smoke, do not smoke without supervision.  If you are feeling better, you may resume normal activities 24 hours after you were sedated.  Keep all appointments with your health care provider. SEEK MEDICAL CARE IF:  Your skin is pale or bluish in color.  You continue to feel nauseous or vomit.  Your pain is getting worse and is not helped by medicine.  You have bleeding or swelling.  You are still sleepy or feeling clumsy after 24 hours. SEEK IMMEDIATE MEDICAL CARE IF:  You develop a rash.  You have difficulty breathing.  You develop any type of allergic problem.  You have a fever. MAKE SURE YOU:  Understand these instructions.  Will watch your condition.  Will get help right away if you are not doing well or get worse.   This information is not intended to replace advice given to you by your health care provider. Make sure you discuss any questions you have with your health care provider.   Document Released: 04/13/2013 Document Revised: 07/14/2014 Document Reviewed: 04/13/2013 Elsevier Interactive Patient Education 2016 [employer]  How to Use Chlorhexidine Before Surgery Chlorhexidine is a germ-killing(antiseptic) solution that is used to clean the skin. It gets rid of the bacteria that usually live on the skin. Because your skin needs to be very clean before you have surgery, your caregiver may give you chlorhexidine to wash with before the surgery. You may be given chlorhexidine to use at home. For example, you may be told to wash with chlorhexidine the night  before and the morning of your surgery. Washing with chlorhexidine before surgery helps prevent infections from developing after the surgery.  HOW TO USE CHLORHEXIDINE  Only use chlorhexidine as directed by your caregiver. Do not use more chlorhexidine than you are told to, and do not use it more often than you are told. Make sure you follow the instructions on the label. If you have any questions, call your caregiver. You can use chlorhexidine while taking a shower or a bath. Unless instructed otherwise, follow these steps when using chlorhexidine: 1. Wash your face and your hair using the soap and shampoo that you normally use. 2. Turn off the shower or stand up in the tub. Then, wash with chlorhexidine. To wash with chlorhexidine:  If you were given a special sponge, use that. If you were not given a sponge, use only a cloth. Do not use any sort of brush. If the sponge has a brush on one side, do not use that side.  Start at your neck and scrub down to your toes. Avoid your genital area. Chlorhexidine can cause a skin reaction. Also, avoid any areas of skin that have cuts  or scrapes.  Pay special attention to the part of your body where you will be having surgery. Scrub this area for at least 1 minute.  Be sure to get your back and under your arms.  Do not use chlorhexidine on your head. If the solution gets into your ears or eyes, rinse them well with water. Chlorhexidine can cause hearing lossif the solution gets into your ear. It can also harm your eye if it gets in your eye and is not rinsed out.  Rinse your body completely in the shower or tub. Be sure that all body creases and crevices are rinsed well. 3. Dry off with a clean towel. Do not put any substances on your body afterward, such as powder, lotion, or perfume. 4. Put on clean clothing. 5. If it is the night before your surgery, sleep in clean sheets. SEEK MEDICAL CARE IF:   Your skin gets irritated after scrubbing. SEEK  IMMEDIATE MEDICAL CARE IF:   Your eyes become very red or swollen.  Your eyes itch badly.  Your skin itches badly and is red or swollen.  Your hearing changes.  You have trouble seeing.  You have trouble breathing.  You swallow any chlorhexidine.   This information is not intended to replace advice given to you by your health care provider. Make sure you discuss any questions you have with your health care provider.   Document Released: 03/17/2012 Document Revised: 07/14/2014 Document Reviewed: 03/17/2012 Elsevier Interactive Patient Education [employer]

## 2016-04-14 NOTE — Procedures (Signed)
Interventional Radiology Procedure Note  Procedure:  Right ureteral catheter placement via percutaneous renal access  Complications: None  Estimated Blood Loss: < 10 mL  Access to right renal collecting system via LP calyx.  5 Fr catheter negotiated around pelvic calculus and down ureter just into bladder lumen.  Jodi MarbleGlenn T. Fredia SorrowYamagata, M.D Pager:  [phone]

## 2016-04-14 NOTE — Consult Note (Signed)
Chief Complaint: Patient was seen in consultation today for right nephroureteral catheter placement/nephrostomy  Referring Physician(s): Tannenbaum,Sigmund  Supervising Physician: Irish Lack  Patient Status: Outpatient  History of Present Illness: Kyle Mata is a 30 y.o. male with history of recurrent nephrolithiasis who presents today for right nephroureteral catheter placement prior to planned nephrolithotomy on 04/17/16.  Past Medical History:  Diagnosis Date  . Anxiety   . History of urinary tract infection   . Kidney stone     Past Surgical History:  Procedure Laterality Date  . CYSTOSCOPY/RETROGRADE/URETEROSCOPY Right 11/04/2013   Procedure: CYSTOSCOPY, RETROGRADE, URETEROSCOPY, RIGHT STENT PLACEMENT;  Surgeon: Kathi Ludwig, MD;  Location: [facility];  Service: Urology;  Laterality: Right;  . HOLMIUM LASER APPLICATION Right 11/04/2013   Procedure: HOLMIUM LASER APPLICATION;  Surgeon: Kathi Ludwig, MD;  Location: [facility];  Service: Urology;  Laterality: Right;  . LITHOTRIPSY      Allergies: Sulfa antibiotics  Medications: Prior to Admission medications   Not on File     History reviewed. No pertinent family history.  Social History   Social History  . Marital status: Single    Spouse name: N/A  . Number of children: N/A  . Years of education: N/A   Social History Main Topics  . Smoking status: Never Smoker  . Smokeless tobacco: Never Used  . Alcohol use No  . Drug use: No  . Sexual activity: Not Asked   Other Topics Concern  . None   Social History Narrative  . None      Review of Systems currently denies fever, headache, chest pain, dyspnea, cough, abdominal/back pain, nausea, vomiting, hematuria/dysuria  Vital Signs: BP 132/76 (BP Location: Right Arm)   Pulse 72   Temp 98.3 F (36.8 C) (Oral)   Resp 18   Ht 6' 0.5" (1.842 m)   Wt 193 lb (87.5 kg)   SpO2 98%   BMI 25.82 kg/m   Physical Exam awake, alert.  Chest clear to auscultation bilaterally. Heart with regular rate and rhythm. Abdomen soft, positive bowel sounds, nontender. Lower extremities with no edema  Mallampati Score:     Imaging: No results found.  Labs:  CBC:  Recent Labs  04/14/16 1220  WBC 10.2  HGB 17.3*  HCT 49.4  PLT 236    COAGS:  Recent Labs  04/14/16 1220  INR 0.96  APTT 29    BMP: No results for input(s): NA, K, CL, CO2, GLUCOSE, BUN, CALCIUM, CREATININE, GFRNONAA, GFRAA in the last 8760 hours.  Invalid input(s): CMP  LIVER FUNCTION TESTS: No results for input(s): BILITOT, AST, ALT, ALKPHOS, PROT, ALBUMIN in the last 8760 hours.  TUMOR MARKERS: No results for input(s): AFPTM, CEA, CA199, CHROMGRNA in the last 8760 hours.  Assessment and Plan: 30 y.o. male with history of recurrent nephrolithiasis who presents today for right nephroureteral catheter placement prior to planned nephrolithotomy on 04/17/16. Risks and benefits discussed with the patient/family including, but not limited to infection, bleeding, significant bleeding causing loss or decrease in renal function or damage to adjacent structures. All of the patient's questions were answered, patient is agreeable to proceed.Consent signed and in chart.    Thank you for this interesting consult.  I greatly enjoyed meeting [employer] and look forward to participating in their care.  A copy of this report was sent to the requesting provider on this date.  Electronically Signed: D. Caryn Bee [name] 04/14/2016, 1:03 PM   I spent a total of  25 minutes   in face to face in clinical consultation, greater than 50% of which was counseling/coordinating care for right nephroureteral catheter placement/nephrostomy

## 2016-04-14 NOTE — Progress Notes (Signed)
Idelle LeechDustin Roethler had a procedure on 04/14/16 and will have surgery on 04/17/16. He will be out of work until 04/21/16. Any questions please call [facility].  Pauline AusPam Tinsley Everman RN [phone]

## 2016-04-16 ENCOUNTER — Encounter (HOSPITAL_COMMUNITY): Payer: Self-pay | Admitting: Anesthesiology

## 2016-04-16 NOTE — Anesthesia Preprocedure Evaluation (Addendum)
Anesthesia Evaluation  Patient identified by MRN, date of birth, ID band Patient awake    Reviewed: Allergy & Precautions, NPO status , Patient's Chart, lab work & pertinent test results  Airway Mallampati: I  TM Distance: >3 FB Neck ROM: Full    Dental  (+) Teeth Intact, Dental Advisory Given   Pulmonary neg pulmonary ROS,    breath sounds clear to auscultation       Cardiovascular negative cardio ROS   Rhythm:Regular Rate:Normal     Neuro/Psych Anxiety negative neurological ROS     GI/Hepatic negative GI ROS, Neg liver ROS,   Endo/Other  negative endocrine ROS  Renal/GU   negative genitourinary   Musculoskeletal negative musculoskeletal ROS (+)   Abdominal   Peds negative pediatric ROS (+)  Hematology negative hematology ROS (+)   Anesthesia Other Findings   Reproductive/Obstetrics negative OB ROS                            Lab Results  Component Value Date   WBC 10.2 04/14/2016   HGB 17.3 (H) 04/14/2016   HCT 49.4 04/14/2016   MCV 85.9 04/14/2016   PLT 236 04/14/2016   Lab Results  Component Value Date   CREATININE 1.46 (H) 03/01/2013   BUN 19 03/01/2013   NA 143 03/01/2013   K 4.2 03/01/2013   CL 104 03/01/2013   CO2 29 03/01/2013   Lab Results  Component Value Date   INR 0.96 04/14/2016     Anesthesia Physical Anesthesia Plan  ASA: II  Anesthesia Plan: General   Post-op Pain Management:    Induction: Intravenous  Airway Management Planned: Oral ETT and Video Laryngoscope Planned  Additional Equipment:   Intra-op Plan:   Post-operative Plan: Extubation in OR  Informed Consent: I have reviewed the patients History and Physical, chart, labs and discussed the procedure including the risks, benefits and alternatives for the proposed anesthesia with the patient or authorized representative who has indicated his/her understanding and acceptance.   Dental  advisory given  Plan Discussed with: CRNA  Anesthesia Plan Comments:       Anesthesia Quick Evaluation

## 2016-04-17 ENCOUNTER — Ambulatory Visit (HOSPITAL_COMMUNITY): Payer: Self-pay

## 2016-04-17 ENCOUNTER — Encounter (HOSPITAL_COMMUNITY): Payer: Self-pay

## 2016-04-17 ENCOUNTER — Ambulatory Visit (HOSPITAL_COMMUNITY): Payer: Self-pay | Admitting: Anesthesiology

## 2016-04-17 ENCOUNTER — Observation Stay (HOSPITAL_COMMUNITY)
Admission: RE | Admit: 2016-04-17 | Discharge: 2016-04-18 | Disposition: A | Discharge status: Home / Self Care | Payer: Self-pay | Source: Ambulatory Visit | Attending: Urology | Admitting: Urology

## 2016-04-17 ENCOUNTER — Encounter (HOSPITAL_COMMUNITY)
Admission: RE | Disposition: A | Discharge status: Home / Self Care | Payer: Self-pay | Source: Ambulatory Visit | Attending: Urology

## 2016-04-17 DIAGNOSIS — N200 Calculus of kidney: Principal | ICD-10-CM

## 2016-04-17 DIAGNOSIS — Z87442 Personal history of urinary calculi: Secondary | ICD-10-CM | POA: Insufficient documentation

## 2016-04-17 DIAGNOSIS — F419 Anxiety disorder, unspecified: Secondary | ICD-10-CM | POA: Insufficient documentation

## 2016-04-17 DIAGNOSIS — K219 Gastro-esophageal reflux disease without esophagitis: Secondary | ICD-10-CM | POA: Insufficient documentation

## 2016-04-17 HISTORY — PX: NEPHROLITHOTOMY: SHX5134

## 2016-04-17 HISTORY — DX: Anxiety disorder, unspecified: F41.9

## 2016-04-17 HISTORY — DX: Personal history of urinary (tract) infections: Z87.440

## 2016-04-17 LAB — BASIC METABOLIC PANEL
Anion gap: 6 (ref 5–15)
BUN: 15 mg/dL (ref 6–20)
CHLORIDE: 105 mmol/L (ref 101–111)
CO2: 27 mmol/L (ref 22–32)
CREATININE: 1.3 mg/dL — AB (ref 0.61–1.24)
Calcium: 9.2 mg/dL (ref 8.9–10.3)
GFR calc Af Amer: >60 mL/min (ref >60)
GFR calc non Af Amer: >60 mL/min (ref >60)
Glucose, Bld: 100 mg/dL — ABNORMAL HIGH (ref 65–99)
Potassium: 4.1 mmol/L (ref 3.5–5.1)
Sodium: 138 mmol/L (ref 135–145)

## 2016-04-17 LAB — CBC
HEMATOCRIT: 47.9 % (ref 39.0–52.0)
HEMOGLOBIN: 16.4 g/dL (ref 13.0–17.0)
MCH: 29.7 pg (ref 26.0–34.0)
MCHC: 34.2 g/dL (ref 30.0–36.0)
MCV: 86.8 fL (ref 78.0–100.0)
Platelets: 237 10*3/uL (ref 150–400)
RBC: 5.52 MIL/uL (ref 4.22–5.81)
RDW: 12.7 % (ref 11.5–15.5)
WBC: 12.8 10*3/uL — ABNORMAL HIGH (ref 4.0–10.5)

## 2016-04-17 SURGERY — NEPHROLITHOTOMY PERCUTANEOUS
Anesthesia: General | Laterality: Right

## 2016-04-17 MED ORDER — HYDROMORPHONE HCL 1 MG/ML IJ SOLN: 0.2500 mg | INTRAMUSCULAR | Status: DC | PRN

## 2016-04-17 MED ORDER — KETOROLAC TROMETHAMINE 30 MG/ML IJ SOLN
INTRAMUSCULAR | Status: DC | PRN
  Administered 2016-04-17: 30 mg via INTRAVENOUS

## 2016-04-17 MED ORDER — SENNOSIDES-DOCUSATE SODIUM 8.6-50 MG PO TABS
2.0000 | ORAL_TABLET | Freq: Every day | ORAL | Status: DC
  Administered 2016-04-17: 2 via ORAL
  Filled 2016-04-17: qty 2

## 2016-04-17 MED ORDER — CIPROFLOXACIN IN D5W 400 MG/200ML IV SOLN
INTRAVENOUS | Status: AC
  Filled 2016-04-17: qty 200

## 2016-04-17 MED ORDER — LACTATED RINGERS IV SOLN: INTRAVENOUS | Status: DC

## 2016-04-17 MED ORDER — SUCCINYLCHOLINE CHLORIDE 200 MG/10ML IV SOSY
PREFILLED_SYRINGE | INTRAVENOUS | Status: DC | PRN
  Administered 2016-04-17: 120 mg via INTRAVENOUS

## 2016-04-17 MED ORDER — LIDOCAINE 2% (20 MG/ML) 5 ML SYRINGE
INTRAMUSCULAR | Status: DC | PRN
  Administered 2016-04-17: 50 mg via INTRAVENOUS

## 2016-04-17 MED ORDER — HYDROMORPHONE HCL 1 MG/ML IJ SOLN
0.5000 mg | INTRAMUSCULAR | Status: DC | PRN
  Administered 2016-04-17 (×2): 0.5 mg via INTRAVENOUS
  Administered 2016-04-17 – 2016-04-18 (×3): 1 mg via INTRAVENOUS
  Filled 2016-04-17 (×6): qty 1

## 2016-04-17 MED ORDER — CIPROFLOXACIN IN D5W 400 MG/200ML IV SOLN
400.0000 mg | INTRAVENOUS | Status: AC
  Administered 2016-04-17: 400 mg via INTRAVENOUS

## 2016-04-17 MED ORDER — HYDROMORPHONE HCL 2 MG/ML IJ SOLN
INTRAMUSCULAR | Status: AC
  Filled 2016-04-17: qty 1

## 2016-04-17 MED ORDER — PROPOFOL 10 MG/ML IV BOLUS
INTRAVENOUS | Status: AC
  Filled 2016-04-17: qty 20

## 2016-04-17 MED ORDER — ROCURONIUM BROMIDE 10 MG/ML (PF) SYRINGE
PREFILLED_SYRINGE | INTRAVENOUS | Status: DC | PRN
  Administered 2016-04-17: 10 mg via INTRAVENOUS
  Administered 2016-04-17: 40 mg via INTRAVENOUS
  Administered 2016-04-17: 10 mg via INTRAVENOUS

## 2016-04-17 MED ORDER — PROMETHAZINE HCL 25 MG/ML IJ SOLN
6.2500 mg | INTRAMUSCULAR | Status: DC | PRN
Start: 2016-04-17 — End: 2016-04-17

## 2016-04-17 MED ORDER — LACTATED RINGERS IV SOLN
INTRAVENOUS | Status: DC | PRN
  Administered 2016-04-17 (×2): via INTRAVENOUS

## 2016-04-17 MED ORDER — OXYCODONE HCL 5 MG PO TABS
5.0000 mg | ORAL_TABLET | ORAL | Status: DC | PRN
  Administered 2016-04-18 (×2): 5 mg via ORAL
  Filled 2016-04-17 (×2): qty 1

## 2016-04-17 MED ORDER — DIPHENHYDRAMINE HCL 12.5 MG/5ML PO ELIX: 12.5000 mg | ORAL_SOLUTION | Freq: Four times a day (QID) | ORAL | Status: DC | PRN

## 2016-04-17 MED ORDER — ONDANSETRON HCL 4 MG/2ML IJ SOLN
4.0000 mg | INTRAMUSCULAR | Status: DC | PRN
  Administered 2016-04-17 – 2016-04-18 (×2): 4 mg via INTRAVENOUS
  Filled 2016-04-17 (×2): qty 2

## 2016-04-17 MED ORDER — ONDANSETRON HCL 4 MG/2ML IJ SOLN
INTRAMUSCULAR | Status: DC | PRN
  Administered 2016-04-17: 4 mg via INTRAVENOUS

## 2016-04-17 MED ORDER — ACETAMINOPHEN 500 MG PO TABS
1000.0000 mg | ORAL_TABLET | Freq: Four times a day (QID) | ORAL | Status: AC
  Administered 2016-04-17 – 2016-04-18 (×3): 1000 mg via ORAL
  Filled 2016-04-17 (×3): qty 2

## 2016-04-17 MED ORDER — SODIUM CHLORIDE 0.9 % IR SOLN
Status: DC | PRN
  Administered 2016-04-17: 24000 mL

## 2016-04-17 MED ORDER — ACETAMINOPHEN 325 MG PO TABS: 650.0000 mg | ORAL_TABLET | ORAL | Status: DC | PRN

## 2016-04-17 MED ORDER — DOCUSATE SODIUM 100 MG PO CAPS
100.0000 mg | ORAL_CAPSULE | Freq: Two times a day (BID) | ORAL | Status: DC
  Administered 2016-04-17 – 2016-04-18 (×3): 100 mg via ORAL
  Filled 2016-04-17 (×3): qty 1

## 2016-04-17 MED ORDER — FENTANYL CITRATE (PF) 100 MCG/2ML IJ SOLN
INTRAMUSCULAR | Status: DC | PRN
  Administered 2016-04-17 (×3): 50 ug via INTRAVENOUS
  Administered 2016-04-17: 100 ug via INTRAVENOUS

## 2016-04-17 MED ORDER — OXYBUTYNIN CHLORIDE 5 MG PO TABS
5.0000 mg | ORAL_TABLET | Freq: Three times a day (TID) | ORAL | Status: DC | PRN
  Administered 2016-04-17: 5 mg via ORAL
  Filled 2016-04-17: qty 1

## 2016-04-17 MED ORDER — SODIUM CHLORIDE 0.9 % IV SOLN
INTRAVENOUS | Status: DC | PRN
  Administered 2016-04-17: 70 mL

## 2016-04-17 MED ORDER — PROPOFOL 10 MG/ML IV BOLUS
INTRAVENOUS | Status: DC | PRN
  Administered 2016-04-17: 200 mg via INTRAVENOUS

## 2016-04-17 MED ORDER — BUPIVACAINE HCL (PF) 0.25 % IJ SOLN
INTRAMUSCULAR | Status: DC | PRN
  Administered 2016-04-17: 20 mL

## 2016-04-17 MED ORDER — HYDROMORPHONE HCL 1 MG/ML IJ SOLN
INTRAMUSCULAR | Status: DC | PRN
  Administered 2016-04-17 (×3): 0.5 mg via INTRAVENOUS
  Administered 2016-04-17: 1 mg via INTRAVENOUS
  Administered 2016-04-17: 0.5 mg via INTRAVENOUS

## 2016-04-17 MED ORDER — MEPERIDINE HCL 50 MG/ML IJ SOLN: 6.2500 mg | INTRAMUSCULAR | Status: DC | PRN

## 2016-04-17 MED ORDER — BUPIVACAINE HCL (PF) 0.25 % IJ SOLN
INTRAMUSCULAR | Status: AC
  Filled 2016-04-17: qty 30

## 2016-04-17 MED ORDER — CEPHALEXIN 500 MG PO CAPS
500.0000 mg | ORAL_CAPSULE | Freq: Three times a day (TID) | ORAL | Status: DC
  Administered 2016-04-17 – 2016-04-18 (×4): 500 mg via ORAL
  Filled 2016-04-17 (×4): qty 1

## 2016-04-17 MED ORDER — BUPIVACAINE LIPOSOME 1.3 % IJ SUSP
20.0000 mL | Freq: Once | INTRAMUSCULAR | Status: AC
  Administered 2016-04-17: 20 mL
  Filled 2016-04-17: qty 20

## 2016-04-17 MED ORDER — MIDAZOLAM HCL 2 MG/2ML IJ SOLN
INTRAMUSCULAR | Status: AC
  Filled 2016-04-17: qty 2

## 2016-04-17 MED ORDER — DEXTROSE-NACL 5-0.45 % IV SOLN
INTRAVENOUS | Status: DC
  Administered 2016-04-17 – 2016-04-18 (×3): via INTRAVENOUS

## 2016-04-17 MED ORDER — DIPHENHYDRAMINE HCL 50 MG/ML IJ SOLN: 12.5000 mg | Freq: Four times a day (QID) | INTRAMUSCULAR | Status: DC | PRN

## 2016-04-17 MED ORDER — SUGAMMADEX SODIUM 200 MG/2ML IV SOLN
INTRAVENOUS | Status: DC | PRN
  Administered 2016-04-17: 180 mg via INTRAVENOUS

## 2016-04-17 MED ORDER — LACTATED RINGERS IV BOLUS (SEPSIS)
1000.0000 mL | Freq: Once | INTRAVENOUS | Status: AC
  Administered 2016-04-17: 1000 mL via INTRAVENOUS

## 2016-04-17 MED ORDER — FENTANYL CITRATE (PF) 250 MCG/5ML IJ SOLN
INTRAMUSCULAR | Status: AC
  Filled 2016-04-17: qty 5

## 2016-04-17 SURGICAL SUPPLY — 49 items
APL SKNCLS STERI-STRIP NONHPOA (GAUZE/BANDAGES/DRESSINGS)
BAG URINE DRAINAGE (UROLOGICAL SUPPLIES) ×1 IMPLANT
BAG URINE LEG 500ML (DRAIN) ×2 IMPLANT
BASKET ZERO TIP NITINOL 2.4FR (BASKET) ×3 IMPLANT
BENZOIN TINCTURE PRP APPL 2/3 (GAUZE/BANDAGES/DRESSINGS) ×2 IMPLANT
BSKT STON RTRVL ZERO TP 2.4FR (BASKET) ×1
CATCHER STONE W/TUBE ADAPTER (UROLOGICAL SUPPLIES) ×1 IMPLANT
CATH FOLEY 2W COUNCIL 20FR 5CC (CATHETERS) ×2 IMPLANT
CATH IMAGER II 65CM (CATHETERS) ×2 IMPLANT
CATH ROBINSON RED A/P 20FR (CATHETERS) IMPLANT
CATH URET DUAL LUMEN 6-10FR 50 (CATHETERS) ×2 IMPLANT
CATH X-FORCE N30 NEPHROSTOMY (TUBING) ×3 IMPLANT
COVER SURGICAL LIGHT HANDLE (MISCELLANEOUS) ×1 IMPLANT
DRAPE C-ARM 42X120 X-RAY (DRAPES) ×3 IMPLANT
DRAPE LINGEMAN PERC (DRAPES) ×3 IMPLANT
DRAPE SURG IRRIG POUCH 19X23 (DRAPES) ×3 IMPLANT
DRSG PAD ABDOMINAL 8X10 ST (GAUZE/BANDAGES/DRESSINGS) ×6 IMPLANT
DRSG TEGADERM 8X12 (GAUZE/BANDAGES/DRESSINGS) ×4 IMPLANT
FIBER LASER FLEXIVA 1000 (UROLOGICAL SUPPLIES) IMPLANT
FIBER LASER FLEXIVA 365 (UROLOGICAL SUPPLIES) IMPLANT
FIBER LASER FLEXIVA 550 (UROLOGICAL SUPPLIES) IMPLANT
FIBER LASER TRAC TIP (UROLOGICAL SUPPLIES) IMPLANT
GAUZE SPONGE 4X4 12PLY STRL (GAUZE/BANDAGES/DRESSINGS) ×1 IMPLANT
GLOVE BIOGEL M STRL SZ7.5 (GLOVE) ×3 IMPLANT
GOWN STRL REUS W/TWL XL LVL3 (GOWN DISPOSABLE) ×5 IMPLANT
GUIDEWIRE STR DUAL SENSOR (WIRE) ×4 IMPLANT
KIT BASIN OR (CUSTOM PROCEDURE TRAY) ×3 IMPLANT
MANIFOLD NEPTUNE II (INSTRUMENTS) ×3 IMPLANT
NEEDLE HYPO 22GX1.5 SAFETY (NEEDLE) ×2 IMPLANT
NS IRRIG 1000ML POUR BTL (IV SOLUTION) ×3 IMPLANT
PACK CYSTO (CUSTOM PROCEDURE TRAY) ×6 IMPLANT
PAD ABD 8X10 STRL (GAUZE/BANDAGES/DRESSINGS) ×2 IMPLANT
POSITIONER SURGICAL ARM (MISCELLANEOUS) ×2 IMPLANT
PROBE LITHOCLAST ULTRA 3.8X403 (UROLOGICAL SUPPLIES) ×2 IMPLANT
PROBE PNEUMATIC 1.0MMX570MM (UROLOGICAL SUPPLIES) ×3 IMPLANT
SET IRRIG Y TYPE TUR BLADDER L (SET/KITS/TRAYS/PACK) ×1 IMPLANT
SPONGE LAP 4X18 X RAY DECT (DISPOSABLE) ×3 IMPLANT
STONE CATCHER W/TUBE ADAPTER (UROLOGICAL SUPPLIES) ×3 IMPLANT
SUT SILK 2 0 30  PSL (SUTURE) ×2
SUT SILK 2 0 30 PSL (SUTURE) ×1 IMPLANT
SYR 20CC LL (SYRINGE) ×6 IMPLANT
SYRINGE 10CC LL (SYRINGE) ×3 IMPLANT
TOWEL OR NON WOVEN STRL DISP B (DISPOSABLE) ×3 IMPLANT
TRAY FOLEY W/METER SILVER 16FR (SET/KITS/TRAYS/PACK) ×3 IMPLANT
TUBE CONNECTING VINYL 14FR 30C (MISCELLANEOUS) ×2 IMPLANT
TUBING CONNECTING 10 (TUBING) ×5 IMPLANT
TUBING CONNECTING 10' (TUBING) ×2
WATER STERILE IRR 1500ML POUR (IV SOLUTION) ×1 IMPLANT
WATER STERILE IRR 500ML POUR (IV SOLUTION) ×2 IMPLANT

## 2016-04-17 NOTE — Discharge Summary (Signed)
Physician Discharge Summary  Patient ID: Kyle Mata MRN: [id number] DOB/AGE: 11/02/1985 30 y.o.  Admit date: 04/17/2016 Discharge date: 04/18/2016  Admission Diagnoses: Right renal stone >2cm  Discharge Diagnoses:  Active Problems:   Renal calculus, right   Discharged Condition: good  Hospital Course: Berlin Mokry is a 30yo male who is s/p right sided PCNL, antegrade nephrostogram, simple Foley placement, right PCN placement with Dr. Gaynelle Arabian on 04/17/16 for large right renal stone burden. He tolerated the procedure well and was extubated in the Or. He was taken to the recovery unit for routine postoperative care prior to being transferred to the floor.  His Foley catheter and [name] ureteral access catheter were removed on POD1. He voided spontaneously. His right sided 20Fr councill tip nephrostomy catheter was kept in place. By POD1 he had met all of the usual goals for discharge including tolerating regular diet, having pain controlled with PO PRNs, ambulating at a preoperative capacity and voiding spontaneously.  He will be discharged with prescriptions for percocet, colace/senna, and 1 week course of keflex 500 TID. He will follow up next week with Dr. Gaynelle Arabian for PCN removal and KUB & antegrade nephrostogram.  Consults: None  Significant Diagnostic Studies: labs: WNL  Treatments: surgery: as noted above  Discharge Exam: Blood pressure (!) 162/75, pulse 97, temperature 98.9 F (37.2 C), temperature source Oral, resp. rate 16, height 6' 0.5" (1.842 m), weight 197 lb 8 oz (89.6 kg), SpO2 97 %.  General: NAD, A&O, pleasant young male Respiratory: nonlabored respirations on RA Cardiovascular: HDS, adequate peripheral perfusion Abdominal: soft, NTTP, nondistended Back: right back dressing overlying PCN site that is c/d/i Gu: voiding spontaneously via urethra, right PCN draining light strawberry urine with no clot Extremities: warm, well perfused, no c/c/e Neuro:  no focal deficits  Disposition: 01-Home or Self Care  Discharge Instructions    Activity as tolerated - No restrictions    Complete by:  As directed    Call MD for:    Complete by:  As directed    Temperature >101.5   Call MD for:  persistant nausea and vomiting    Complete by:  As directed    Call MD for:  redness, tenderness, or signs of infection (pain, swelling, redness, odor or green/yellow discharge around incision site)    Complete by:  As directed    Call MD for:  severe uncontrolled pain    Complete by:  As directed    Diet general    Complete by:  As directed    Discharge instructions    Complete by:  As directed    Continue the nephrostomy tube to drainage bag for 1 week. We will plan to remove this at your appointment with Dr. Gaynelle Arabian next week.   Take percocet as needed for pain management. This medication can make you constipated. Take colace & senna stool softeners while taking percocet. You should not drive while taking percocet for pain management as it can reduce your reaction time and put you and others at risk.  Take antibiotics (keflex) three times a day for 1 week following the procedure.   Increase activity slowly    Complete by:  As directed    No wound care    Complete by:  As directed        Medication List    You have not been prescribed any medications.    Follow-up Information    SIGMUND I TANNENBAUM, MD Follow up in 1 week(s).   Specialty:  Urology Contact information: [address] [place] [phone]           Signed: Burnice Logan 04/18/2016, 4:05 PM

## 2016-04-17 NOTE — Op Note (Signed)
Preoperative Diagnosis:   Right renal stone greater than 2 cm   Postoperative Diagnosis:   Right renal stone greater than 2 cm   Procedure(s) Performed:   1. Right percutaneous nephrostolithotomy for stone burden greater than 2 cm 2. Antegrade nephrostogram with nephrostomy tube placement 3. Simple Foley catheter placement 4. Intraoperative fluoroscopy with interpretation less than 1 hour   Teaching Surgeon:  [name], MD   Resident Surgeon:  Molli Hazard Macey,M.D.   Assistants:  None listed   Anesthesia:  General via endotracheal tube   IV Fluids:  See Anesthesia record   Estimated Blood Loss:  200 mL's.   Cultures:  None   Drains: all to straight drainage 1. Right 20Fr councill tip catheter serving as nephrostomy tube 2. 30fr [name] catheter adjacent to nephrostomy tube 3. 16Fr urethral Foley catheter   Specimens:  Right renal stone for chemical analysis    Complications:  None   Indications for Surgery:   Kyle Mata is 30yo male with history of anxiety, GERD and a right large UPJ calculus and other lower pole right renal stones. Diameter of largest stone is 2.1 x 1.4cm. He presents today for percutaneous treatment of his right renal stones. He is s/p placement of a 7fr [name] catheter into the bladder via lower pole renal access by IR 10/9. The risks and benefits of the procedure were discussed with the patient who wishes to proceed.   Operative Findings:  Entirety of stone burden treated. Some trauma to the renal pelvis on placement of sheath, mild extravasation noted on nephrostogram. Successful placement of nephrostomy tube and [name] catheter for ureteral access.   Radiologic Interpretation of Anterograde Nephrostogram: Antegrade nephrostogram post-treatment demonstrated no filling defects, mild contrast extravasation from the access tract and likely from the medial renal pelvis. Adequate drainage down the right ureter noted. Appropriate positioning of 20Fr  councill tip catheter noted.    Procedure:  The patient was correctly identified in the preoperative holding area where written informed consent as well as potential risks and complications were reviewed. The patient was brought back to the operative suite where a preinduction timeout was performed. Once correct information was verified, general anesthesia was induced via endotracheal tube. A 16Fr Foley catheter was placed into the bladder and 10cc sterile water was placed in the balloon. The patient was then gently repositioned into the prone position, paying careful attention to pad all pressure points and affixed the patient to the bed at multiple points of contact.  He was then prepped and draped in the usual sterile fashion and given appropriate perioperative procedural antibiotics with Cipro.  Sequential compression devices were placed for VTE prophylaxis.  A second timeout was then performed.    We began our procedure by cannulating the indwelling [name] catheter with a sensor wire which was advanced into the bladder under fluoroscopic guidance. The [name] catheter was removed. A 6 to 10Fr dual lumen coaxial catheter was then advanced into the renal pelvis over the wire. A second sensor wire was then placed down the ureter into the bladder as well. The dual lumen coaxial catheter was removed. We then developed our percutaneous tract with the advancement of a 30 French x 20 cm Nephromax balloon dilator.  After this, a 30 French sheath was advanced to the edge of the distal calyx on fluoroscopy.     We then performed rigid nephroscopy with the lithotrite. We had to reposition the sheath using the guidance of the sensor wires. Immediately upon entrance into the  renal pelvis, we encountered the stone and we then proceeded to treat the stone with lithotripsy. There was some trauma noted to the medial renal pelvic wall, likely from either balloon dilation or sheath placement or a combination of both. After  reaching thr limit of the stone capable of being treated with the rigid scope, we then switched to flexible nephroscopy and removed additional stone using the zero tip nitinol basket. We transitioned back between the flexible and rigid nephroscopes as needed to ensure all stone burden was removed. The graspers were used through the rigid nephroscope to evacuate a large majority of the stone.   At the conclusion of the procedure, we repeated flexible nephroscopy in the kidney and performed an antegrade nephrostogram with findings as above. The flexible nephroscope was placed in an upper pole calyx. One of two sensor wires was then advanced through the flexible nephroscope and repositioned into the upper pole calyx. The flexible nephroscope was removed leaving the sensor wire in place.    At this point, we elected to leave our nephrostomy tube.  We passed a 20 French Councill tip nephrostomy tube. Antegrade nephrostogram demonstrated complete filling of the entire renal collecting system with minimal contrast extravasation from the access tract and likely minor medial renal pelvis extravasation and satisfactory placement of her nephrostomy tube in the renal pelvis.  2 mL's of sterile water was placed in the balloon. A 555fr [name] catheter was then advanced over the remaining safety sensor wire into the bladder under fluoroscopic guidance and the sensor wire was removed (adjacent to the nephrostomy tube. The nephrostomy tube and [name] catheter were affixed to the patient's skin using a single 0 silk suture in interrupted fashion. 40cc total of diluted Exparel was instill locally around the nephrostomy tube site. The kumpe catheter was attached to an adapter to attach to a leg bag for straight drainage. The 20Fr nephrostomy tube was attached to a standing gravity bag. The site was then dressed in the usual gauze and tape dressing and the patient was carefully returned to supine position.  At this point, the patient  was extubated and taken to the recovery area in stable fashion.   Post-Op Plan/Instructions:   1. Admit patient to Urology Service for routine postoperative care 2. We will perform serial laboratory studies  3. Expect Foley removal & [name] catheter removal on POD1 4. Will continue PCN for 1 week postoperatively with plan to remove in clinic next week with Dr. Patsi Searsannenbaum, and have KUB at that visit as well.   Dr. Patsi Searsannenbaum was present for the entire procedure. Urology Attending Note: I was present for, and participated in , all aspects of this patient's surgical care.

## 2016-04-17 NOTE — Interval H&P Note (Signed)
History and Physical Interval Note:  04/17/2016 7:26 AM  Kyle Mata  has presented today for surgery, with the diagnosis of right renal pelvic stone  The various methods of treatment have been discussed with the patient and family. After consideration of risks, benefits and other options for treatment, the patient has consented to  Procedure(s): NEPHROLITHOTOMY RIGHT PERCUTANEOUS (Right) as a surgical intervention .  The patient's history has been reviewed, patient examined, no change in status, stable for surgery.  I have reviewed the patient's chart and labs.  Questions were answered to the patient's satisfaction.     Shyna Duignan I Tiana Sivertson

## 2016-04-17 NOTE — Transfer of Care (Signed)
Immediate Anesthesia Transfer of Care Note  Patient: Kyle Mata  Procedure(s) Performed: Procedure(s): NEPHROLITHOTOMY RIGHT PERCUTANEOUS (Right)  Patient Location: PACU  Anesthesia Type:General  Level of Consciousness:  sedated, patient cooperative and responds to stimulation  Airway & Oxygen Therapy:Patient Spontanous Breathing and Patient connected to face mask oxgen  Post-op Assessment:  Report given to PACU RN and Post -op Vital signs reviewed and stable  Post vital signs:  Reviewed and stable  Last Vitals:  Vitals:   04/17/16 0505  BP: 127/86  Pulse: 81  Resp: 18  Temp: 36.8 C    Complications: No apparent anesthesia complications

## 2016-04-17 NOTE — Anesthesia Procedure Notes (Signed)
Procedure Name: Intubation Date/Time: 04/17/2016 7:36 AM Performed by: Paris LoreBLANTON, Tully Burgo M Pre-anesthesia Checklist: Patient identified, Emergency Drugs available, Suction available, Patient being monitored and Timeout performed Patient Re-evaluated:Patient Re-evaluated prior to inductionOxygen Delivery Method: Circle system utilized Preoxygenation: Pre-oxygenation with 100% oxygen Intubation Type: IV induction Ventilation: Mask ventilation without difficulty Laryngoscope Size: Mac and 4 Grade View: Grade I Tube type: Oral Tube size: 7.5 mm Number of attempts: 1 Airway Equipment and Method: Video-laryngoscopy and Stylet Placement Confirmation: ETT inserted through vocal cords under direct vision,  positive ETCO2,  CO2 detector and breath sounds checked- equal and bilateral Secured at: 21 cm Tube secured with: Tape Dental Injury: Teeth and Oropharynx as per pre-operative assessment  Difficulty Due To: Difficult Airway- due to anterior larynx Comments: Reviewed earlier note and preceded with video scope intubation with noted GRADE 1 view X 1 attempt.  Noted slight anterior larynx and placed tube without incidence or trauma.

## 2016-04-17 NOTE — H&P (Signed)
Office Visit Report     02/18/2016   --------------------------------------------------------------------------------   Kyle Mata  MRN: [id number]  PRIMARY CARE:  Jeb Levering. Anner Crete, FNP  DOB: [birth year], 30 year old Male  REFERRING:    [id number]  PROVIDER:  [name], M.D.    LOCATION:  [facility] [phone]   --------------------------------------------------------------------------------   CC: I have kidney stones.  HPI: Kyle Mata is a 30 year-old male established patient who is here for renal calculi.  He first stated noticing pain on approximately 11/05/2015. This is not his first kidney stone. He has had 5 stones prior to getting this one. He is not currently having flank pain, back pain, groin pain, nausea, vomiting, fever or chills. He has caught a stone in his urine strainer since his symptoms began.   He has had eswl and ureteroscopy for treatment of his stones in the past.   Has passed 2 stones over past 4 months with last stone 2 months ago. No longer taking Potassium Citrate. Now with chronic bladder pain that is worse with exercise/work. Denies hematuria.   He returns today to review CT results & discuss surgery.     ALLERGIES: Sulfa Drugs - Other Reaction, unknown    MEDICATIONS: Potassium Citrate Er 10 meq (1,080 mg) tablet, extended release 1 tablet PO TID  Tamsulosin HCl - 0.4 MG Oral Capsule 0 Oral  Tramadol Hcl 50 mg tablet 1 tablet PO Q 6 H PRN  Zinc 1 PO Daily     GU PSH: Cysto Uretero Lithotripsy - 2015 Locm 300-399Mg /Ml Iodine,1Ml - 02/04/2016 Renal ESWL - 2014    NON-GU PSH: None   GU PMH: Asymptomatic microscopic hematuria, most likely related to large (R) UPJ calculus - 02/01/2016 Chronic bladder pain (Chronic) - 02/01/2016 Kidney Stone (Worsening, Chronic), Right, Large 15 X 23 mm (R) UPJ calculus - 02/01/2016, Bilateral kidney stones, - 01/10/2015 Urinary Retention, Unspec, Urinary retention - 2015 Personal Hx  urinary calculi, Nephrolithiasis - 2014    NON-GU PMH: Encounter for general adult medical examination without abnormal findings, Encounter for preventive health examination - 01/10/2015 Anxiety disorder, unspecified, Anxiety (Symptom) - 2014 Personal history of other diseases of the digestive system, History of esophageal reflux - 2014    FAMILY HISTORY: Family Health Status - Father's Age - Runs In Family Family Health Status - Mother's Age - Runs In Family Family Health Status Number - Runs In Family Kidney Stones - Runs In Family No Significant Family History - Runs In Family   SOCIAL HISTORY: Marital Status: Single Current Smoking Status: Patient has never smoked.  Has never drank.  Does not drink caffeine.    REVIEW OF SYSTEMS:    GU Review Male:   Patient reports trouble starting your stream. Patient denies frequent urination, hard to postpone urination, burning/ pain with urination, get up at night to urinate, leakage of urine, stream starts and stops, have to strain to urinate , erection problems, and penile pain.  Gastrointestinal (Upper):   Patient denies nausea, vomiting, and indigestion/ heartburn.  Gastrointestinal (Lower):   Patient denies constipation and diarrhea.  Constitutional:   Patient reports fatigue. Patient denies fever, night sweats, and weight loss.  Skin:   Patient denies skin rash/ lesion and itching.  Eyes:   Patient denies blurred vision and double vision.  Ears/ Nose/ Throat:   Patient denies sore throat and sinus problems.  Hematologic/Lymphatic:   Patient denies swollen glands and easy bruising.  Cardiovascular:   Patient denies leg  swelling and chest pains.  Respiratory:   Patient denies cough and shortness of breath.  Endocrine:   Patient denies excessive thirst.  Musculoskeletal:   Patient denies back pain and joint pain.  Neurological:   Patient denies headaches and dizziness.  Psychologic:   Patient reports anxiety. Patient denies depression.    Notes: Reviewed previous review of systems 02/01/2016. No changes.    VITAL SIGNS:      02/18/2016 08:47 AM  BP 148/86 mmHg  Pulse 58 /min  Temperature 98.3 F / 37 C   GU PHYSICAL EXAMINATION:    Anus and Perineum: No hemorrhoids. No anal stenosis. No rectal fissure, no anal fissure. No edema, no dimple, no perineal tenderness, no anal tenderness.  Scrotum: No lesions. No edema. No cysts. No warts.  Epididymides: Right: no spermatocele, no masses, no cysts, no tenderness, no induration, no enlargement. Left: no spermatocele, no masses, no cysts, no tenderness, no induration, no enlargement.  Testes: No tenderness, no swelling, no enlargement left testes. No tenderness, no swelling, no enlargement right testes. Normal location left testes. Normal location right testes. No mass, no cyst, no varicocele, no hydrocele left testes. No mass, no cyst, no varicocele, no hydrocele right testes.  Urethral Meatus: Normal size. No lesion, no wart, no discharge, no polyp. Normal location.  Penis: Circumcised, no warts, no cracks. No dorsal Peyronie's plaques, no left corporal Peyronie's plaques, no right corporal Peyronie's plaques, no scarring, no warts. No balanitis, no meatal stenosis.  Prostate: 40 gram or 2+ size. Left lobe normal consistency, right lobe normal consistency. Symmetrical lobes. No prostate nodule. Left lobe no tenderness, right lobe no tenderness.  Seminal Vesicles: Nonpalpable.  Sphincter Tone: Normal sphincter. No rectal tenderness. No rectal mass.    MULTI-SYSTEM PHYSICAL EXAMINATION:    Constitutional: Well-nourished. No physical deformities. Normally developed. Good grooming.  Neck: Neck symmetrical, not swollen. Normal tracheal position.  Respiratory: No labored breathing, no use of accessory muscles.   Cardiovascular: Normal temperature, normal extremity pulses, no swelling, no varicosities.  Lymphatic: No enlargement of neck, axillae, groin.  Skin: No paleness, no  jaundice, no cyanosis. No lesion, no ulcer, no rash.  Neurologic / Psychiatric: Oriented to time, oriented to place, oriented to person. No depression, no anxiety, no agitation.  Gastrointestinal: No mass, no tenderness, no rigidity, non obese abdomen.  Eyes: Normal conjunctivae. Normal eyelids.  Ears, Nose, Mouth, and Throat: Left ear no scars, no lesions, no masses. Right ear no scars, no lesions, no masses. Nose no scars, no lesions, no masses. Normal hearing. Normal lips.  Musculoskeletal: Normal gait and station of head and neck.     PAST DATA REVIEWED:  Source Of History:  Patient  Records Review:   Previous Patient Records  Urine Test Review:   Urinalysis  X-Ray Review: [facility] Protocol: Reviewed Films. Reviewed Report. Discussed With Patient.     02/18/16  Urinalysis  Urine Appearance Clear   Urine Specimen Voided   Urine Color Yellow   Urine Glucose Neg   Urine Bilirubin Neg   Urine Ketones Neg   Urine Specific Gravity 1.020   Urine Blood 3+   Urine pH 6.0   Urine Protein 1+   Urine Urobilinogen 0.2   Urine Nitrites Neg   Urine Leukocyte Esterase Trace   Urine WBC/hpf 6-10/hpf   Urine RBC/hpf 20-40/hpf   Urine Epithelial Cells 0-5/hpf   Urine Bacteria Rare   Urine Mucous Present   Urine Yeast NS (Not Seen)   Urine Trichomonas Not  Present   Urine Cystals NS (Not Seen)   Urine Casts NS (Not Seen)   Urine Sperm Not Present    PROCEDURES:          Urinalysis w/Scope - 81001 Dipstick Dipstick Cont'd Micro  Specimen: Voided Bilirubin: Neg WBC/hpf: 6-10/hpf  Color: Yellow Ketones: Neg RBC/hpf: 20-40/hpf  Appearance: Clear Blood: 3+ Bacteria: Rare  Specific Gravity: 1.020 Protein: 1+ Cystals: NS (Not Seen)  pH: 6.0 Urobilinogen: 0.2 Casts: NS (Not Seen)  Glucose: Neg Nitrites: Neg Trichomonas: Not Present    Leukocyte Esterase: Trace Mucous: Present      Epithelial Cells: 0-5/hpf      Yeast: NS (Not Seen)      Sperm: Not Present    ASSESSMENT:      ICD-10  Details  1 GU:   Kidney Stone - N20.0           Notes:   30 year old male, with CT scan showing bilateral renal calculi. On the right side, there is a 22 mm right renal pelvic stone, with addition additional multiple 1 mm calculi of the right kidney. On the left side, there are 8 calculi within the left kidney, ranging in size from 2-4 mm. There is no ureterolithiasis and no bladder stones present.   I reviewed the films together with the patient, and I believe that he needs to have medical therapy, and needs to start on hydrochlorothiazide/lisinopril, with allopurinol 100 mg. He will need to have percutaneous nephrolithotomy. He will have creatinine/gfr today.     PLAN:            Medications New Meds: Allopurinol 100 mg tablet 1 tablet PO Daily   #30  11 Refill(s)  Valsartan-Hydrochlorothiazide 80 mg-12.5 mg tablet 1 tablet PO Daily   #30  11 Refill(s)            Orders Labs BUN, Creatinine and GFR, 24 Hour Urine  Lab Notes: [facility]          Schedule Return Visit: Next Available Appointment - Schedule Surgery             Note: Schedule surgery in Oct for Rt PCNL.         IR URETERAL STENT RIGHT NEW ACCESS W/O SEP NEPHROSTOMY CATH (Accession [id number]) (Order [id number])  Imaging  Date: 04/14/2016 [facility] Released By: Wille Glasereirdre K Bauer Authorizing: Jethro BolusSigmund Oluwatobi Visser, MD  Exam Information   Status Exam Begun  Exam Ended   Final [99] 04/14/2016 1:47 PM 04/14/2016 2:39 PM  PACS Images   Show images for IR URETERAL STENT RIGHT NEW ACCESS W/O SEP NEPHROSTOMY CATH  Study Result   CLINICAL DATA:  Dominant renal calculus of the right renal pelvis measuring just over 2 cm. Percutaneous ureteral access required prior to planned operative nephrolithotomy later this week.  EXAM: 1. ULTRASOUND GUIDANCE FOR PUNCTURE OF THE RIGHT RENAL COLLECTING SYSTEM. 2. LEFT PERCUTANEOUS URETERAL CATHETER PLACEMENT VIA  NEPHROSTOMY ACCESS.  COMPARISON:  None.  ANESTHESIA/SEDATION: 6.0 mg IV Versed; 125 mcg IV Fentanyl.  Total Moderate Sedation Time  28 minutes.  CONTRAST:  15 mL Isovue-300 injected into the right renal collecting system.  MEDICATIONS: 2 g IV Ancef. Antibiotic was administered in an appropriate time frame prior to skin puncture.  FLUOROSCOPY TIME:  4 minutes and 12 seconds.  129 mGy.  PROCEDURE: The procedure, risks, benefits, and alternatives were explained to the patient. Questions regarding the procedure were encouraged and answered. The patient understands and consents to the  procedure. A time-out was performed prior to initiating the procedure.  The right flank region was prepped with Betadine in a sterile fashion, and a sterile drape was applied covering the operative field. A sterile gown and sterile gloves were used for the procedure. Local anesthesia was provided with 1% Lidocaine.  Ultrasound was used to localize the right kidney. Under direct ultrasound guidance, a 21 gauge needle was advanced into the renal collecting system. Ultrasound image documentation was performed. Aspiration of urine sample was performed followed by contrast injection.  Under fluoroscopy, puncture of a right lower pole calyx was performed. A guidewire was then advanced into the collecting system. A transitional dilator was advanced over a guidewire. Additional guidewire was then advanced through the transitional dilator. A 5 French catheter was then advanced into the collecting system. This was directed through the renal pelvis and down the right ureter. The catheter was advanced just into the bladder lumen. The catheter was then secured at the skin with Ethilon retention sutures and overlying dressing.  COMPLICATIONS: None.  FINDINGS: Initial fluoroscopy confirms the presence of a rounded and very radio-opaque calculus within the right renal pelvis.  Ultrasound demonstrates mild prominence of the right renal collecting system without significant hydronephrosis. Initial lower pole puncture was performed in order to opacify the collecting system with contrast. This access was performed under ultrasound resulting in puncture of a lower pole infundibulum.  Contrast opacification allowed more direct access of a posterior lower pole calyx. Due to high positioning of the kidney, access was between the eleventh and twelfth ribs. A 5 French catheter was able to be advanced beyond the renal pelvic calculus and down the ureter.  IMPRESSION: Percutaneous ureteral catheter placement via lower pole access of the right renal collecting system. A 5 French catheter was advanced into the bladder and will be utilized for nephrolithotomy access later this week.   Electronically Signed   By: Irish Lack M.D.   On: 04/14/2016 15:04

## 2016-04-17 NOTE — Discharge Instructions (Signed)
Percutaneous Nephrostomy, Care After °Refer to this sheet in the next few weeks. These instructions provide you with information on caring for yourself after your procedure. Your health care provider may also give you more specific instructions. Your treatment has been planned according to current medical practices, but problems sometimes occur. Call your health care provider if you have any problems or questions after your procedure. °WHAT TO EXPECT AFTER THE PROCEDURE °You will need to remain lying down for several hours. °HOME CARE INSTRUCTIONS °· Your nephrostomy tube is connected to a leg bag or bedside drainage bag. Always keep the tubing, the leg bag, or the bedside drainage bags below the level of the kidney so that the urine drains freely. °· During the day, if you are connecting the nephrostomy tube to a leg bag, be sure there are no kinks in the tubing and that the urine is draining freely. °· At night, you may want to connect the nephrostomy tube or the leg bag to a larger bedside drainage bag. °· Change the dressing as often as directed by your health care provider, or if it becomes wet. °¨ Gently remove the tapes and dressing from around the nephrostomy tube. Be careful not to pull on the tube while removing the dressing. °¨ Wash the skin around the tube, rinse well, and dry. °¨ Place two split drain sponges in and around the tube exit site. °¨ Place tape around edge of the dressing. °¨ Secure the nephrostomy tubing. Remember to make certain that the nephrostomy tube does not kink or become pinched closed. It can be useful to wrap any exposed tubing going from the nephrostomy tube to any of the connecting tubes to either the leg bag or drainage bag with an elastic bandage. °· Every three weeks, replace the leg bag, drainage bag, and any extension tubing connected to your nephrostomy tube. Your health care provider will explain how to change the drainage bag and extension tubing. °SEEK MEDICAL CARE  IF: °· You experience any problems with any of the valves or tubing. °· You have persistent pain or soreness in your back. °· You have a fever or chills. °SEEK IMMEDIATE MEDICAL CARE IF: °· You have abdominal pain during the first week. °· You have a new appearance of blood in your urine. °· You have back pain that is not relieved by your medicine. °· You have drainage, redness, swelling, or pain at the tube insertion site. °· You have decreased urine output. °· Your nephrostomy tube comes out. °  °This information is not intended to replace advice given to you by your health care provider. Make sure you discuss any questions you have with your health care provider. °  °Document Released: 02/14/2004 Document Revised: 07/14/2014 Document Reviewed: 02/17/2013 °Elsevier Interactive Patient Education ©2016 [employer] ° °

## 2016-04-17 NOTE — Anesthesia Postprocedure Evaluation (Signed)
Anesthesia Post Note  Patient: Kyle Mata  Procedure(s) Performed: Procedure(s) (LRB): NEPHROLITHOTOMY RIGHT PERCUTANEOUS (Right)  Patient location during evaluation: PACU Anesthesia Type: General Level of consciousness: awake and alert Pain management: pain level controlled Vital Signs Assessment: post-procedure vital signs reviewed and stable Respiratory status: spontaneous breathing, nonlabored ventilation, respiratory function stable and patient connected to nasal cannula oxygen Cardiovascular status: blood pressure returned to baseline and stable Postop Assessment: no signs of nausea or vomiting Anesthetic complications: no    Last Vitals:  Vitals:   04/17/16 1142 04/17/16 1307  BP: (!) 141/75 (!) 168/86  Pulse: 82 94  Resp: 16 16  Temp: 36.6 C 36.9 C    Last Pain:  Vitals:   04/17/16 1307  TempSrc: Oral  PainSc:                  Shelton SilvasKevin D Dontavious Emily

## 2016-04-17 NOTE — Care Management Note (Signed)
Case Management Note  Patient Details  Name: Fortino SicDustin L Linker MRN: [id number] Date of Birth: 1986/02/03  Subjective/Objective:   30 y/o m admitted w/renal calculus. From home.                 Action/Plan:d/c plan home.   Expected Discharge Date:                  Expected Discharge Plan:  Home/Self Care  In-House Referral:     Discharge planning Services  CM Consult  Post Acute Care Choice:    Choice offered to:     DME Arranged:    DME Agency:     HH Arranged:    HH Agency:     Status of Service:  In process, will continue to follow  If discussed at Long Length of Stay Meetings, dates discussed:    Additional Comments:  Lanier ClamMahabir, Jo Booze, RN 04/17/2016, 2:21 PM
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
## 2016-04-17 NOTE — Progress Notes (Signed)
Right percutaneous nephrostomy dressing clean, dry and intact

## 2016-04-18 LAB — BASIC METABOLIC PANEL
Anion gap: 4 — ABNORMAL LOW (ref 5–15)
BUN: 10 mg/dL (ref 6–20)
CHLORIDE: 106 mmol/L (ref 101–111)
CO2: 29 mmol/L (ref 22–32)
CREATININE: 1.18 mg/dL (ref 0.61–1.24)
Calcium: 9 mg/dL (ref 8.9–10.3)
Glucose, Bld: 104 mg/dL — ABNORMAL HIGH (ref 65–99)
Potassium: 4.3 mmol/L (ref 3.5–5.1)
SODIUM: 139 mmol/L (ref 135–145)

## 2016-04-18 LAB — CBC
HCT: 42.7 % (ref 39.0–52.0)
HEMOGLOBIN: 14.2 g/dL (ref 13.0–17.0)
MCH: 29.2 pg (ref 26.0–34.0)
MCHC: 33.3 g/dL (ref 30.0–36.0)
MCV: 87.9 fL (ref 78.0–100.0)
PLATELETS: 227 10*3/uL (ref 150–400)
RBC: 4.86 MIL/uL (ref 4.22–5.81)
RDW: 12.8 % (ref 11.5–15.5)
WBC: 11.9 10*3/uL — ABNORMAL HIGH (ref 4.0–10.5)

## 2016-04-18 MED ORDER — IBUPROFEN 200 MG PO TABS: 600.0000 mg | ORAL_TABLET | Freq: Four times a day (QID) | ORAL | Status: DC | PRN
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
## 2016-04-18 NOTE — Progress Notes (Signed)
Taking over care of patient. Agree with previous RN assessment. Denies any needs at this time. Will continue to monitor.  

## 2016-04-18 NOTE — Care Management Note (Signed)
Case Management Note  Patient Details  Name: Kyle Mata MRN: [id number] Date of Birth: Jul 31, 1985  Subjective/Objective: Provided patient w/pcp listing, health insurance resource list,$4 [employer] meds,reasonable med asst websites(goodrx,needymeds).                    Action/Plan:d/c plan home.   Expected Discharge Date:                  Expected Discharge Plan:  Home/Self Care  In-House Referral:     Discharge planning Services  CM Consult, Medication Assistance, Indigent Health Clinic  Post Acute Care Choice:    Choice offered to:     DME Arranged:    DME Agency:     HH Arranged:    HH Agency:     Status of Service:  In process, will continue to follow  If discussed at Long Length of Stay Meetings, dates discussed:    Additional Comments:  Lanier ClamMahabir, Ketrick Matney, RN 04/18/2016, 3:39 PM

## 2016-04-18 NOTE — Progress Notes (Signed)
UROLOGY PROGRESS NOTES  Assessment/Plan: Kyle Mata is a 30 y.o. male with a history of anxiety and right renal calculus >2cm s/p right PCNL on 04/17/16 with Dr. Patsi Searsannenbaum.   Interval/Plan: [name] catheter removed this morning. Foley discontinued, patient passed TOV. Pain moderately controlled. OOB to chair.   Neuro: - pain control with tylenol PRN, oxycodone PRN, ibuprofen PRN, dilaudid PRN - benadryl PRN  Respiratory: SORA  CV: HDS  FEN/GI: - medlock, replete lytes prn, regular diet - bowel regimen with colace/senna - PRN zofran   GU: - monitor UOP - Foley catheter removed, passed TOV; [name] removed; right 20Fr PCN draining strawberry urine - creatinine 1.18 - discontinue oxybutynin  Heme/ID: - afebrile, stable - WBC WNL at 11.9 - Hb 14.2 POD1, continue to monitor - keflex 500 TID x 7d postop  PPx: OOB/IS  Dispo: Floor status, possible discharge later today   Subjective: No n/v. OOB to chair. Pain moderately controlled. Having trouble getting comfortable in bed, sleeping mostly on back. Tolerating clears.  Objective:  Vital signs in last 24 hours: Temp:  [97.9 F (36.6 C)-99.2 F (37.3 C)] 98.8 F (37.1 C) (10/13 0917) Pulse Rate:  [77-107] 88 (10/13 0917) Resp:  [12-21] 16 (10/13 0917) BP: (125-168)/(70-86) 153/83 (10/13 0917) SpO2:  [93 %-100 %] 98 % (10/13 0917)  10/12 0701 - 10/13 0700 In: 4448.8 [P.O.:480; I.V.:3968.8] Out: 3980 [Urine:3780; Blood:200]    Physical Exam:  General:  well-developed and well-nourished male in NAD, lying in bed, alert & oriented, pleasant HEENT: Kyle Mata/AT, EOMI, sclera anicteric, hearing grossly intact, no nasal discharge, MMM Respiratory: nonlabored respirations, satting well on RA, symmetrical chest rise Cardiovascular: pulse regular rate & rhythm Abdominal: soft, NTTP, nondistended GU: [name] removed, right sided PCN draining strawberry urine, dressing changed at bedside Extremities: warm, well-perfused, no  c/c/e Neuro: no focal deficits   Data Review: Results for orders placed or performed during the hospital encounter of 04/17/16 (from the past 24 hour(s))  CBC     Status: Abnormal   Collection Time: 04/17/16 10:36 AM  Result Value Ref Range   WBC 12.8 (H) 4.0 - 10.5 K/uL   RBC 5.52 4.22 - 5.81 MIL/uL   Hemoglobin 16.4 13.0 - 17.0 g/dL   HCT 19.147.9 47.839.0 - 29.552.0 %   MCV 86.8 78.0 - 100.0 fL   MCH 29.7 26.0 - 34.0 pg   MCHC 34.2 30.0 - 36.0 g/dL   RDW 62.112.7 30.811.5 - 65.715.5 %   Platelets 237 150 - 400 K/uL  Basic metabolic panel     Status: Abnormal   Collection Time: 04/17/16 10:36 AM  Result Value Ref Range   Sodium 138 135 - 145 mmol/L   Potassium 4.1 3.5 - 5.1 mmol/L   Chloride 105 101 - 111 mmol/L   CO2 27 22 - 32 mmol/L   Glucose, Bld 100 (H) 65 - 99 mg/dL   BUN 15 6 - 20 mg/dL   Creatinine, Ser 8.461.30 (H) 0.61 - 1.24 mg/dL   Calcium 9.2 8.9 - 96.210.3 mg/dL   GFR calc non Af Amer >60 >60 mL/min   GFR calc Af Amer >60 >60 mL/min   Anion gap 6 5 - 15  CBC     Status: Abnormal   Collection Time: 04/18/16  5:57 AM  Result Value Ref Range   WBC 11.9 (H) 4.0 - 10.5 K/uL   RBC 4.86 4.22 - 5.81 MIL/uL   Hemoglobin 14.2 13.0 - 17.0 g/dL   HCT 95.242.7 84.139.0 - 32.452.0 %  MCV 87.9 78.0 - 100.0 fL   MCH 29.2 26.0 - 34.0 pg   MCHC 33.3 30.0 - 36.0 g/dL   RDW 16.1 09.6 - 04.5 %   Platelets 227 150 - 400 K/uL  Basic metabolic panel     Status: Abnormal   Collection Time: 04/18/16  5:57 AM  Result Value Ref Range   Sodium 139 135 - 145 mmol/L   Potassium 4.3 3.5 - 5.1 mmol/L   Chloride 106 101 - 111 mmol/L   CO2 29 22 - 32 mmol/L   Glucose, Bld 104 (H) 65 - 99 mg/dL   BUN 10 6 - 20 mg/dL   Creatinine, Ser 4.09 0.61 - 1.24 mg/dL   Calcium 9.0 8.9 - 81.1 mg/dL   GFR calc non Af Amer >60 >60 mL/min   GFR calc Af Amer >60 >60 mL/min   Anion gap 4 (L) 5 - 15    Imaging: None

## 2016-04-18 NOTE — Progress Notes (Signed)
Urology Progress Note  1 Day Post-Op   Subjective:  Slight pain overnight. Urine pink. No clots.     No acute urologic events overnight. Ambulation:   positive Flatus:    positive Bowel movement  negative  Pain: some relief  Objective:  Blood pressure (!) 152/85, pulse 90, temperature 98.5 F (36.9 C), temperature source Oral, resp. rate 16, height 6' 0.5" (1.842 m), weight 89.6 kg (197 lb 8 oz), SpO2 99 %.  Physical Exam:  General:  No acute distress, awake Extremities: extremities normal, atraumatic, no cyanosis or edema Genitourinary:  neg Foley: out    I/O last 3 completed shifts: In: 4448.8 [P.O.:480; I.V.:3968.8] Out: 3980 [Urine:3780; Blood:200]  Recent Labs     04/17/16  1036  04/18/16  0557  HGB  16.4  14.2  WBC  12.8*  11.9*  PLT  237  227    Recent Labs     04/17/16  1036  04/18/16  0557  NA  138  139  K  4.1  4.3  CL  105  106  CO2  27  29  BUN  15  10  CREATININE  1.30*  1.18  CALCIUM  9.2  9.0  GFRNONAA  >60  >60  GFRAA  >60  >60     No results for input(s): INR, APTT in the last 72 hours.  Invalid input(s): PT   Invalid input(s): ABG  Assessment/Plan:   Stable P: D/c Komfy catheeter this AM Leave  RR cath RTC next week for nephrostogram and nephrostomy tube removal.  D/c today.  Social Service consult for ? Medicaid application.

## 2017-12-09 IMAGING — RF DG ABDOMEN 1V
1 series · 2 of 2 positions shown · non-contrast
Comparison: CT 01/05/16

CLINICAL DATA: Status post stone removal

EXAM:
ABDOMEN - 1 VIEW; DG C-ARM GT 120 MIN-NO REPORT

[Series 1: run · 2 of 2 slices shown]
[im 1/2]
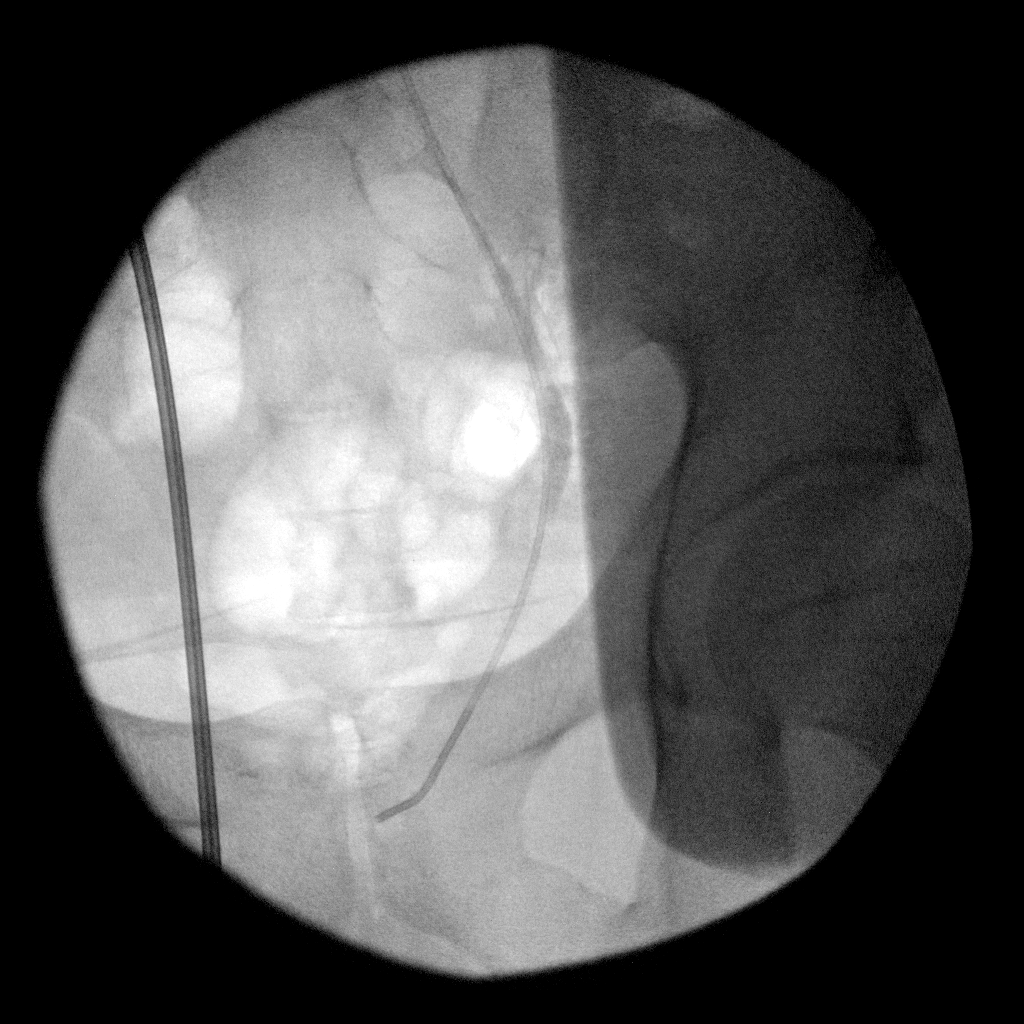
[im 2/2]
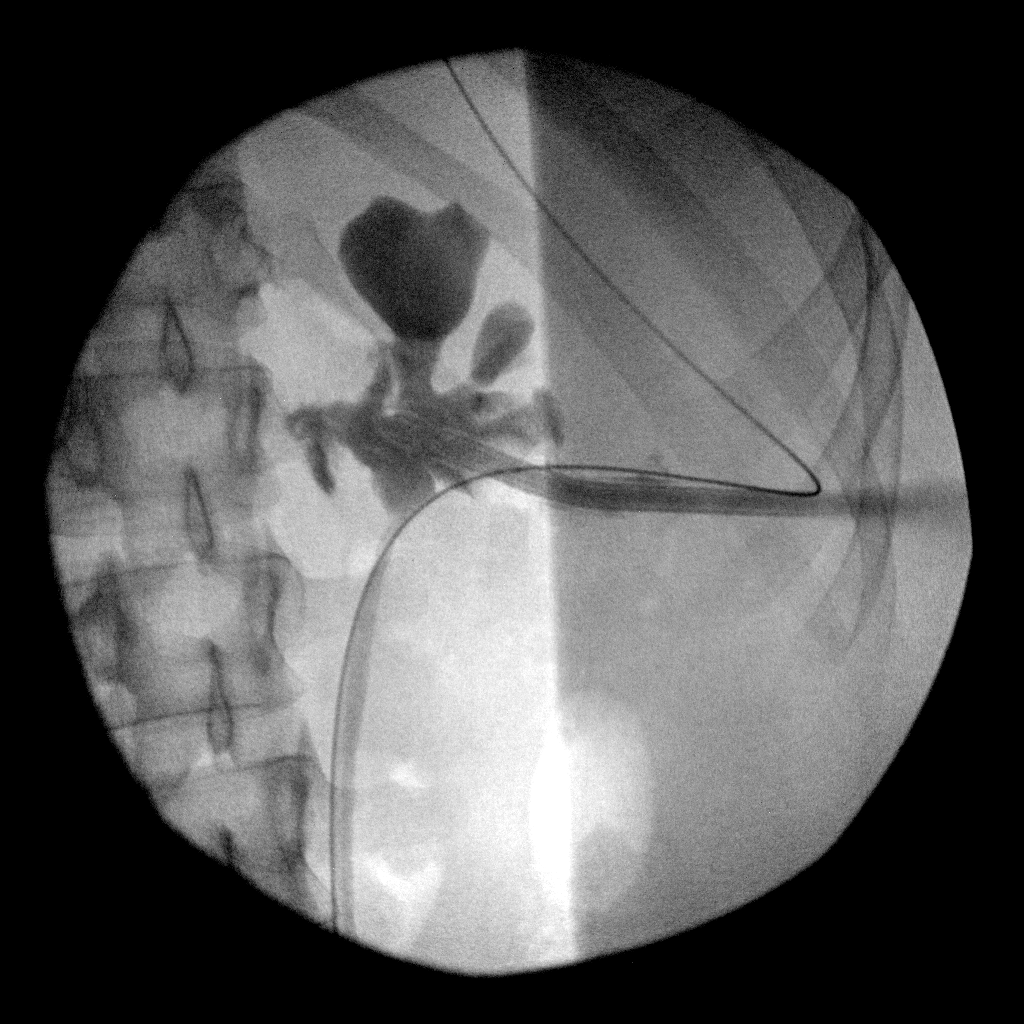

[2 of 2 positions shown; findings below may reference images not displayed]

FINDINGS: Two portable intraoperative images were submitted. The first image
is centered over the right pelvis in shows contrast within the
ureter as well as a right ureteral stent. On the second image there
is contrast opacification of the right renal collecting system. A
percutaneous nephro ureteral stent is identified and there is a
nephrolithotomy device .
IMPRESSION: See discussion above.

## 2019-10-17 ENCOUNTER — Ambulatory Visit
Admission: EM | Admit: 2019-10-17 | Discharge: 2019-10-17 | Disposition: A | Discharge status: Home / Self Care | Payer: Self-pay | Attending: Physician Assistant | Admitting: Physician Assistant

## 2019-10-17 DIAGNOSIS — M545 Low back pain, unspecified: Secondary | ICD-10-CM

## 2019-10-17 DIAGNOSIS — M79671 Pain in right foot: Secondary | ICD-10-CM

## 2019-10-17 DIAGNOSIS — R102 Pelvic and perineal pain: Secondary | ICD-10-CM

## 2019-10-17 MED ORDER — METHOCARBAMOL 500 MG PO TABS: 500.0000 mg | ORAL_TABLET | Freq: Two times a day (BID) | ORAL | 0 refills | Status: DC

## 2019-10-17 MED ORDER — MELOXICAM 7.5 MG PO TABS: 7.5000 mg | ORAL_TABLET | Freq: Every day | ORAL | 0 refills | Status: DC

## 2019-10-17 NOTE — ED Provider Notes (Signed)
[facility]    CSN: [id number] Arrival date & time: 10/17/19  0910      History   Chief Complaint Chief Complaint  Patient presents with  . Hip Pain    HPI JEREMYAH JELLEY is a 34 y.o. male.   34 year old male comes in for multiple complaints  1. Right hip/back pain now with right foot pain. Has had right hip/back pain for past couple of weeks. States went camping, then started feelling right foot pain about 1 week ago. Unsure if the hip/back pain is related to the right foot. Denies injury/trauma. Denies numbness/tingling. Denies swelling of the joint, erythema, warmth. Took ibuprofen with some relief.  2. Low abdominal pain Low abdominal pain that is intermittent for the past week. Pain is to the suprapubic area, sharp in sensation, with improvement while laying down. Denies current pain. Denies nausea, vomiting. Had loose stools 3-4 days ago that resolved. Denies fever, chills, body aches. Denies urinary symptoms.      Past Medical History:  Diagnosis Date  . Anxiety   . History of urinary tract infection   . Kidney stone     Patient Active Problem List   Diagnosis Date Noted  . Renal calculus, right 04/17/2016  . Nephrolithiasis 12/28/2013    Past Surgical History:  Procedure Laterality Date  . CYSTOSCOPY/RETROGRADE/URETEROSCOPY Right 11/04/2013   Procedure: CYSTOSCOPY, RETROGRADE, URETEROSCOPY, RIGHT STENT PLACEMENT;  Surgeon: Ailene Rud, MD;  Location: [facility];  Service: Urology;  Laterality: Right;  . HOLMIUM LASER APPLICATION Right 0/08/5425   Procedure: HOLMIUM LASER APPLICATION;  Surgeon: Ailene Rud, MD;  Location: [facility];  Service: Urology;  Laterality: Right;  . IR GENERIC HISTORICAL  04/14/2016   IR URETERAL STENT RIGHT NEW ACCESS W/O SEP NEPHROSTOMY CATH 04/14/2016 Aletta Edouard, MD [facility]  . LITHOTRIPSY    . NEPHROLITHOTOMY Right 04/17/2016   Procedure: NEPHROLITHOTOMY RIGHT PERCUTANEOUS;  Surgeon: Carolan Clines, MD;  Location: [facility];  Service: Urology;  Laterality: Right;  . NEPHROSTOMY Right 04/14/2016   Nephrourteral cath placement       Home Medications    Prior to Admission medications   Medication Sig Start Date End Date Taking? Authorizing Provider  meloxicam (MOBIC) 7.5 MG tablet Take 1 tablet (7.5 mg total) by mouth daily. 10/17/19   Tasia Catchings,  V, PA-C  methocarbamol (ROBAXIN) 500 MG tablet Take 1 tablet (500 mg total) by mouth 2 (two) times daily. 10/17/19   Ok Edwards, PA-C    Family History History reviewed. No pertinent family history.  Social History Social History   Tobacco Use  . Smoking status: Never Smoker  . Smokeless tobacco: Never Used  Substance Use Topics  . Alcohol use: No  . Drug use: No     Allergies   Sulfa antibiotics   Review of Systems Review of Systems  Reason unable to perform ROS: See HPI as above.     Physical Exam Triage Vital Signs ED Triage Vitals  Enc Vitals Group     BP 10/17/19 0920 (!) 149/87     Pulse Rate 10/17/19 0920 66     Resp 10/17/19 0920 16     Temp 10/17/19 0920 98 F (36.7 C)     Temp Source 10/17/19 0920 Oral     SpO2 10/17/19 0920 97 %     Weight --      Height --      Head Circumference --      Peak Flow --  Pain Score 10/17/19 0927 2     Pain Loc --      Pain Edu? --      Excl. in GC? --    No data found.  Updated Vital Signs BP (!) 149/87 (BP Location: Left Arm)   Pulse 66   Temp 98 F (36.7 C) (Oral)   Resp 16   SpO2 97%   Physical Exam Constitutional:      General: He is not in acute distress.    Appearance: He is well-developed. He is not diaphoretic.  HENT:     Head: Normocephalic and atraumatic.  Eyes:     Conjunctiva/sclera: Conjunctivae normal.     Pupils: Pupils are equal, round, and reactive to light.  Cardiovascular:     Rate and Rhythm: Normal rate and regular rhythm.     Heart sounds: Normal heart sounds. No murmur. No friction rub. No gallop.   Pulmonary:      Effort: Pulmonary effort is normal. No accessory muscle usage or respiratory distress.     Breath sounds: Normal breath sounds. No stridor. No decreased breath sounds, wheezing, rhonchi or rales.  Abdominal:     General: Bowel sounds are normal.     Palpations: Abdomen is soft.     Tenderness: There is no abdominal tenderness. There is no right CVA tenderness, left CVA tenderness, guarding or rebound.     Hernia: No hernia is present.  Musculoskeletal:     Comments: No tenderness on palpation of the spinous processes. Tenderness to palpation of right lumbar back/sacral area. No hip tenderness.  Full range of motion of back and hips. Strength normal and equal bilaterally. Sensation intact and equal bilaterally. Negative straight leg raise.  No erythema, warmth, contusion, swelling to the right foot.  No tenderness to palpation of the right ankle/foot.  Full range of motion.  Strength normal ankle bilaterally.  Sensation intact ankle bilaterally.  Pedal pulse 2+.  Skin:    General: Skin is warm and dry.  Neurological:     Mental Status: He is alert and oriented to person, place, and time.    UC Treatments / Results  Labs (all labs ordered are listed, but only abnormal results are displayed) Labs Reviewed - No data to display  EKG   Radiology No results found.  Procedures Procedures (including critical care time)  Medications Ordered in UC Medications - No data to display  Initial Impression / Assessment and Plan / UC Course  I have reviewed the triage vital signs and the nursing notes.  Pertinent labs & imaging results that were available during my care of the patient were reviewed by me and considered in my medical decision making (see chart for details).    1. Right back/foot pain Atraumatic pain. Start NSAIDs, muscle relaxant, ice compress. Return precautions given.  2. Suprapubic abdominal pain Patient is currently asymptomatic. Abdomen soft, +BS, nontender. No evidence  of hernia. To continue to monitor and follow up with PCP if symptoms reoccur. Return precautions given.  Final Clinical Impressions(s) / UC Diagnoses   Final diagnoses:  Acute right-sided low back pain without sciatica  Right foot pain  Suprapubic abdominal pain    ED Prescriptions    Medication Sig Dispense Auth. Provider   meloxicam (MOBIC) 7.5 MG tablet Take 1 tablet (7.5 mg total) by mouth daily. 10 tablet ,  V, PA-C   methocarbamol (ROBAXIN) 500 MG tablet Take 1 tablet (500 mg total) by mouth 2 (two) times daily. 20 tablet  Belinda Fisher, PA-C     PDMP not reviewed this encounter.   Belinda Fisher, PA-C 10/17/19 1143

## 2019-10-17 NOTE — Discharge Instructions (Addendum)
Right back/foot pain Start Mobic. Do not take ibuprofen (motrin/advil)/ naproxen (aleve) while on mobic. Robaxin as needed, this can make you drowsy, so do not take if you are going to drive, operate heavy machinery, or make important decisions. Ice compress to the foot. Follow up with PCP if symptoms not improving.  Abdominal pain No alarming signs on exam. Given no pain at this time, continue to monitor. Keep hydrated, urine should be clear to pale yellow in color. Make sure you have good and soft bowel movements. Follow up with PCP if symptoms recurring. If significant worsening of pain, nausea/vomiting, fever, go to the emergency department for further evaluation.

## 2019-10-17 NOTE — ED Triage Notes (Signed)
Pt c/o pain to center of buttocks to rt hip area down to rt foot. States rt foot is throbbing for over a week. States took ibuprofen with relief. States having pain to lower abdomen under umbilicus area for over a week off and on after lifting something heavy.

## 2022-10-20 ENCOUNTER — Encounter: Payer: Self-pay | Admitting: Emergency Medicine

## 2022-10-23 ENCOUNTER — Other Ambulatory Visit: Payer: Self-pay | Admitting: Emergency Medicine

## 2022-10-23 DIAGNOSIS — K461 Unspecified abdominal hernia with gangrene: Secondary | ICD-10-CM

## 2022-10-23 DIAGNOSIS — R1084 Generalized abdominal pain: Secondary | ICD-10-CM

## 2022-10-31 ENCOUNTER — Ambulatory Visit
Admission: RE | Admit: 2022-10-31 | Discharge: 2022-10-31 | Disposition: A | Discharge status: Home / Self Care | Payer: 59 | Source: Ambulatory Visit | Attending: Family Medicine | Admitting: Family Medicine

## 2022-10-31 DIAGNOSIS — K461 Unspecified abdominal hernia with gangrene: Secondary | ICD-10-CM

## 2022-10-31 DIAGNOSIS — R1084 Generalized abdominal pain: Secondary | ICD-10-CM

## 2023-02-09 ENCOUNTER — Ambulatory Visit: Payer: 59 | Admitting: Nurse Practitioner

## 2023-02-10 ENCOUNTER — Encounter: Payer: Self-pay | Admitting: General Practice

## 2023-04-21 ENCOUNTER — Ambulatory Visit (INDEPENDENT_AMBULATORY_CARE_PROVIDER_SITE_OTHER): Payer: Self-pay | Admitting: Nurse Practitioner

## 2023-04-21 ENCOUNTER — Encounter: Payer: Self-pay | Admitting: Nurse Practitioner

## 2023-04-21 ENCOUNTER — Other Ambulatory Visit: Payer: Self-pay | Admitting: Nurse Practitioner

## 2023-04-21 VITALS — BP 134/88 | HR 63 | Temp 97.8°F | Ht 72.0 in | Wt 210.4 lb

## 2023-04-21 DIAGNOSIS — R5383 Other fatigue: Secondary | ICD-10-CM

## 2023-04-21 DIAGNOSIS — E663 Overweight: Secondary | ICD-10-CM

## 2023-04-21 DIAGNOSIS — Z0000 Encounter for general adult medical examination without abnormal findings: Secondary | ICD-10-CM

## 2023-04-21 DIAGNOSIS — E559 Vitamin D deficiency, unspecified: Secondary | ICD-10-CM

## 2023-04-21 LAB — COMPREHENSIVE METABOLIC PANEL
ALT: 46 U/L (ref 0–53)
AST: 24 U/L (ref 0–37)
Albumin: 5.1 g/dL (ref 3.5–5.2)
Alkaline Phosphatase: 50 U/L (ref 39–117)
BUN: 13 mg/dL (ref 6–23)
CO2: 28 meq/L (ref 19–32)
Calcium: 10.4 mg/dL (ref 8.4–10.5)
Chloride: 100 meq/L (ref 96–112)
Creatinine, Ser: 1.17 mg/dL (ref 0.40–1.50)
GFR: 80.07 mL/min (ref >60.00)
Glucose, Bld: 77 mg/dL (ref 70–99)
Potassium: 4.4 meq/L (ref 3.5–5.1)
Sodium: 138 meq/L (ref 135–145)
Total Bilirubin: 0.6 mg/dL (ref 0.2–1.2)
Total Protein: 7.8 g/dL (ref 6.0–8.3)

## 2023-04-21 LAB — CBC
HCT: 52.3 % — ABNORMAL HIGH (ref 39.0–52.0)
Hemoglobin: 17.5 g/dL — ABNORMAL HIGH (ref 13.0–17.0)
MCHC: 33.4 g/dL (ref 30.0–36.0)
MCV: 86.1 fL (ref 78.0–100.0)
Platelets: 284 10*3/uL (ref 150.0–400.0)
RBC: 6.07 Mil/uL — ABNORMAL HIGH (ref 4.22–5.81)
RDW: 13 % (ref 11.5–15.5)
WBC: 9.3 10*3/uL (ref 4.0–10.5)

## 2023-04-21 LAB — VITAMIN B12: Vitamin B-12: 230 pg/mL (ref 211–911)

## 2023-04-21 LAB — VITAMIN D 25 HYDROXY (VIT D DEFICIENCY, FRACTURES): VITD: 25.03 ng/mL — ABNORMAL LOW (ref 30.00–100.00)

## 2023-04-21 LAB — TSH: TSH: 2.71 u[IU]/mL (ref 0.35–5.50)

## 2023-04-21 MED ORDER — VITAMIN D (ERGOCALCIFEROL) 1.25 MG (50000 UNIT) PO CAPS: 50000.0000 [IU] | ORAL_CAPSULE | ORAL | 0 refills | Status: AC

## 2023-04-21 NOTE — Patient Instructions (Signed)
Nice to see you today I will be in touch with the labs once I have them Follow up with me in 1 year, sooner if you need me   

## 2023-04-21 NOTE — Assessment & Plan Note (Signed)
Discussed age-appropriate immunizations and screening exams.  Did review patient's personal, surgical, social, family histories.  Patient up-to-date on all age-appropriate vaccinations he would like.  Patient is too young for CRC screening or prostate cancer screening.  Patient was given information at discharge about preventative healthcare maintenance with anticipatory guidance

## 2023-04-21 NOTE — Assessment & Plan Note (Signed)
Ambiguous in nature.  Patient was slightly withdrawn.  Will do B12, CBC, CMP, TSH.  If all labs come back normal consider sleep study.  Patient denies depression

## 2023-04-21 NOTE — Progress Notes (Signed)
New Patient Office Visit  Subjective    Patient ID: Kyle Mata, male    DOB: [birth year]  Age: 37 y.o. MRN: [id number]  CC:  Chief Complaint  Patient presents with   Establish Care    HPI Kyle Mata presents to establish care for complete physical and follow up of chronic conditions.  Immunizations: -Tetanus: Completed in unsure  -Influenza: refused  -Shingles: Too young -Pneumonia: Too young Covid; refused   Diet: Fair diet. 2-3 meals a day sometimes he will snack. He will do coffee waster and sometimes a soda (once a day ) Exercise: No regular exercise. States that he has been doing weights and walking. States he will do twice a week at 20 mins   Eye exam: needs updating . Glasses at night  Dental exam: Needs updating   Colonoscopy: Completed in  Lung Cancer Screening: N/A, currently average risk   PSA: too young, currently average risk   Sleep: states bedtime is 9-10 and will get up at 5am. States that he feels rested some times. Does not snore. Wakes up once to go to the bathroom      Outpatient Encounter Medications as of 04/21/2023  Medication Sig   [DISCONTINUED] meloxicam (MOBIC) 7.5 MG tablet Take 1 tablet (7.5 mg total) by mouth daily.   [DISCONTINUED] methocarbamol (ROBAXIN) 500 MG tablet Take 1 tablet (500 mg total) by mouth 2 (two) times daily.   No facility-administered encounter medications on file as of 04/21/2023.    Past Medical History:  Diagnosis Date   Anxiety    History of urinary tract infection    Kidney stone     Past Surgical History:  Procedure Laterality Date   CYSTOSCOPY/RETROGRADE/URETEROSCOPY Right 11/04/2013   Procedure: CYSTOSCOPY, RETROGRADE, URETEROSCOPY, RIGHT STENT PLACEMENT;  Surgeon: Kathi Ludwig, MD;  Location: [facility];  Service: Urology;  Laterality: Right;   HOLMIUM LASER APPLICATION Right 11/04/2013   Procedure: HOLMIUM LASER APPLICATION;  Surgeon: Kathi Ludwig, MD;  Location: [facility];   Service: Urology;  Laterality: Right;   IR GENERIC HISTORICAL  04/14/2016   IR URETERAL STENT RIGHT NEW ACCESS W/O SEP NEPHROSTOMY CATH 04/14/2016 Irish Lack, MD [facility]   LITHOTRIPSY     NEPHROLITHOTOMY Right 04/17/2016   Procedure: NEPHROLITHOTOMY RIGHT PERCUTANEOUS;  Surgeon: [name], MD;  Location: [facility];  Service: Urology;  Laterality: Right;   NEPHROSTOMY Right 04/14/2016   Nephrourteral cath placement    History reviewed. No pertinent family history.  Social History   Socioeconomic History   Marital status: Single    Spouse name: Not on file   Number of children: 1   Years of education: Not on file   Highest education level: Not on file  Occupational History   Not on file  Tobacco Use   Smoking status: Never   Smokeless tobacco: Never  Vaping Use   Vaping status: Never Used  Substance and Sexual Activity   Alcohol use: No   Drug use: No   Sexual activity: Not on file  Other Topics Concern   Not on file  Social History Narrative   Unemployed      [name]   Social Determinants of Health   Financial Resource Strain: Not on file  Food Insecurity: Not on file  Transportation Needs: Not on file  Physical Activity: Not on file  Stress: Not on file  Social Connections: Not on file  Intimate Partner Violence: Not on file    Review of Systems  Constitutional:  Negative for chills and fever.  Respiratory:  Negative for shortness of breath.   Cardiovascular:  Negative for chest pain and leg swelling.  Gastrointestinal:  Negative for abdominal pain, blood in stool, constipation, diarrhea, nausea and vomiting.       BM daily to every other day   Genitourinary:  Negative for dysuria and hematuria.  Neurological:  Negative for tingling and headaches.  Psychiatric/Behavioral:  Negative for hallucinations and suicidal ideas.         Objective    BP 134/88   Pulse 63   Temp 97.8 F (36.6 C) (Oral)   Ht 6' (1.829 m)   Wt 210 lb 6.4 oz  (95.4 kg)   SpO2 94%   BMI 28.54 kg/m   Physical Exam Vitals and nursing note reviewed.  Constitutional:      Appearance: Normal appearance.  HENT:     Right Ear: Tympanic membrane, ear canal and external ear normal.     Left Ear: Tympanic membrane, ear canal and external ear normal.     Mouth/Throat:     Mouth: Mucous membranes are moist.     Pharynx: Oropharynx is clear.  Eyes:     Extraocular Movements: Extraocular movements intact.     Pupils: Pupils are equal, round, and reactive to light.  Cardiovascular:     Rate and Rhythm: Normal rate and regular rhythm.     Pulses: Normal pulses.     Heart sounds: Normal heart sounds.  Pulmonary:     Effort: Pulmonary effort is normal.     Breath sounds: Normal breath sounds.  Abdominal:     General: Bowel sounds are normal. There is no distension.     Palpations: There is no mass.     Tenderness: There is no abdominal tenderness.     Hernia: No hernia is present.  Musculoskeletal:     Right lower leg: No edema.     Left lower leg: No edema.  Lymphadenopathy:     Cervical: No cervical adenopathy.  Skin:    General: Skin is warm.  Neurological:     General: No focal deficit present.     Mental Status: He is alert.     Deep Tendon Reflexes:     Reflex Scores:      Bicep reflexes are 2+ on the right side and 2+ on the left side.      Patellar reflexes are 2+ on the right side and 2+ on the left side.    Comments: Bilateral upper and lower extremity strength 5/5  Psychiatric:        Mood and Affect: Mood normal.        Behavior: Behavior normal.        Thought Content: Thought content normal.        Judgment: Judgment normal.         Assessment & Plan:   Problem List Items Addressed This Visit       Other   Preventative health care - Primary    Discussed age-appropriate immunizations and screening exams.  Did review patient's personal, surgical, social, family histories.  Patient up-to-date on all age-appropriate  vaccinations he would like.  Patient is too young for CRC screening or prostate cancer screening.  Patient was given information at discharge about preventative healthcare maintenance with anticipatory guidance      Relevant Orders   CBC   Comprehensive metabolic panel   Overweight   Fatigue    Ambiguous in nature.  Patient was slightly withdrawn.  Will do B12, CBC, CMP, TSH.  If all labs come back normal consider sleep study.  Patient denies depression      Relevant Orders   CBC   Comprehensive metabolic panel   TSH   VITAMIN D 25 Hydroxy (Vit-D Deficiency, Fractures)   Vitamin B12    Return in about 1 year (around 04/20/2024) for CPE and Labs.   Audria Nine, NP

## 2023-04-22 ENCOUNTER — Telehealth: Payer: Self-pay | Admitting: Nurse Practitioner

## 2023-04-22 NOTE — Telephone Encounter (Signed)
Patient called in returning call he received. Relayed result message to patient.

## 2023-04-23 NOTE — Telephone Encounter (Signed)
Patient called in returning a call he received.

## 2023-04-24 NOTE — Telephone Encounter (Signed)
Result note has been updated that patient has been notified of results.

## 2023-05-22 ENCOUNTER — Telehealth: Payer: Self-pay | Admitting: Nurse Practitioner

## 2023-05-22 NOTE — Telephone Encounter (Signed)
-----   Message from [facility] sent at 04/21/2023  6:10 PM EDT ----- Regarding: fatigue Taking B12 and D help fatigue?

## 2023-05-22 NOTE — Telephone Encounter (Signed)
Can we see if he has been taking the B12 and vitamin D. If so has it helped with his fatigue

## 2023-05-22 NOTE — Telephone Encounter (Signed)
Tried to call pt. VM was full. 

## 2023-05-25 NOTE — Telephone Encounter (Signed)
Called patient states he has been taking Vit D. Not taking the the vitamin b-12. States he has been able to notice an improvement in fatigue. Advised patient will let provider know.

## 2023-05-26 NOTE — Telephone Encounter (Signed)
Can you clarify this for me please. He is taking one but not the other and is feeling better?

## 2023-05-26 NOTE — Telephone Encounter (Signed)
Patient has taken Vit D as directed. He did not start b-12

## 2023-08-07 ENCOUNTER — Encounter (INDEPENDENT_AMBULATORY_CARE_PROVIDER_SITE_OTHER): Payer: Self-pay | Admitting: Nurse Practitioner

## 2023-08-07 ENCOUNTER — Encounter: Payer: Self-pay | Admitting: Nurse Practitioner

## 2023-08-07 NOTE — Progress Notes (Unsigned)
   Established Patient Office Visit  Subjective   Patient ID: Kyle Mata, male    DOB: Aug 12, 1985  Age: 38 y.o. MRN: [id number]  Chief Complaint  Patient presents with   Annual Exam    HPI  {History (Optional):23778}  ROS    Objective:     Ht 6' (1.829 m)   Wt 217 lb (98.4 kg)   BMI 29.43 kg/m  {Vitals History (Optional):23777}  Physical Exam   No results found for any visits on 08/07/23.  {Labs (Optional):23779}  The ASCVD Risk score (Arnett DK, et al., 2019) failed to calculate for the following reasons:   The 2019 ASCVD risk score is only valid for ages 18 to 54    Assessment & Plan:   Problem List Items Addressed This Visit   None   No follow-ups on file.    Audria Nine, NP

## 2023-08-10 NOTE — Progress Notes (Signed)
   Established Patient Office Visit  Subjective   Patient ID: Kyle Mata, male    DOB: [birth year]  Age: 38 y.o. MRN: [id number]  Chief Complaint  Patient presents with   Annual Exam    HPI  error    ROS    Objective:     BP 118/80   Pulse 82   Temp 98.8 F (37.1 C) (Oral)   Ht 6' (1.829 m)   Wt 217 lb (98.4 kg)   SpO2 97%   BMI 29.43 kg/m    Physical Exam   No results found for any visits on 08/07/23.    The ASCVD Risk score (Arnett DK, et al., 2019) failed to calculate for the following reasons:   The 2019 ASCVD risk score is only valid for ages 56 to 57    Assessment & Plan:   Problem List Items Addressed This Visit   None   No follow-ups on file.    Audria Nine, NP

## 2023-10-09 ENCOUNTER — Encounter: Payer: Self-pay | Admitting: Nurse Practitioner
